# Patient Record
Sex: Female | Born: 1970 | Race: White | Hispanic: No | Marital: Married | Smoking: Former smoker
Health system: Southern US, Community
[De-identification: ages and names within clinical notes are randomized; demographics above are authoritative.]

## PROBLEM LIST (undated history)

## (undated) DIAGNOSIS — M542 Cervicalgia: Secondary | ICD-10-CM

## (undated) DIAGNOSIS — N943 Premenstrual tension syndrome: Secondary | ICD-10-CM

## (undated) DIAGNOSIS — N926 Irregular menstruation, unspecified: Secondary | ICD-10-CM

## (undated) DIAGNOSIS — G43919 Migraine, unspecified, intractable, without status migrainosus: Secondary | ICD-10-CM

## (undated) DIAGNOSIS — G4489 Other headache syndrome: Secondary | ICD-10-CM

## (undated) DIAGNOSIS — J301 Allergic rhinitis due to pollen: Secondary | ICD-10-CM

## (undated) DIAGNOSIS — G542 Cervical root disorders, not elsewhere classified: Secondary | ICD-10-CM

## (undated) DIAGNOSIS — N951 Menopausal and female climacteric states: Secondary | ICD-10-CM

## (undated) DIAGNOSIS — G500 Trigeminal neuralgia: Secondary | ICD-10-CM

## (undated) DIAGNOSIS — J302 Other seasonal allergic rhinitis: Secondary | ICD-10-CM

## (undated) DIAGNOSIS — K529 Noninfective gastroenteritis and colitis, unspecified: Secondary | ICD-10-CM

## (undated) DIAGNOSIS — M530 Cervicocranial syndrome: Secondary | ICD-10-CM

## (undated) HISTORY — DX: Noninfective gastroenteritis and colitis, unspecified: K52.9

## (undated) HISTORY — PX: SYMPATHECTOMY: SHX792

## (undated) HISTORY — DX: Other seasonal allergic rhinitis: J30.2

## (undated) HISTORY — DX: Menopausal and female climacteric states: N95.1

## (undated) HISTORY — DX: Cervicocranial syndrome: M53.0

## (undated) HISTORY — DX: Cervical root disorders, not elsewhere classified: G54.2

## (undated) HISTORY — DX: Premenstrual tension syndrome: N94.3

## (undated) HISTORY — DX: Cervicalgia: M54.2

## (undated) HISTORY — DX: Irregular menstruation, unspecified: N92.6

## (undated) HISTORY — DX: Other headache syndrome: G44.89

## (undated) HISTORY — DX: Allergic rhinitis due to pollen: J30.1

## (undated) HISTORY — DX: Migraine, unspecified, intractable, without status migrainosus: G43.919

## (undated) HISTORY — DX: Trigeminal neuralgia: G50.0

---
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
## 2002-03-04 HISTORY — PX: SYMPATHECTOMY: SHX792

## 2015-01-06 ENCOUNTER — Ambulatory Visit: Payer: Managed Care, Other (non HMO) | Admitting: Neurology

## 2015-01-10 ENCOUNTER — Encounter: Payer: Self-pay | Admitting: Neurology

## 2015-01-10 ENCOUNTER — Ambulatory Visit (INDEPENDENT_AMBULATORY_CARE_PROVIDER_SITE_OTHER): Payer: Managed Care, Other (non HMO) | Admitting: Neurology

## 2015-01-10 VITALS — BP 123/72 | HR 82 | Ht 63.0 in | Wt 130.2 lb

## 2015-01-10 VITALS — BP 117/64 | HR 76 | Temp 98.4°F | Ht 63.0 in

## 2015-01-10 DIAGNOSIS — M5481 Occipital neuralgia: Secondary | ICD-10-CM | POA: Diagnosis not present

## 2015-01-10 DIAGNOSIS — G43701 Chronic migraine without aura, not intractable, with status migrainosus: Secondary | ICD-10-CM

## 2015-01-10 DIAGNOSIS — G500 Trigeminal neuralgia: Secondary | ICD-10-CM

## 2015-01-10 DIAGNOSIS — R5382 Chronic fatigue, unspecified: Secondary | ICD-10-CM | POA: Diagnosis not present

## 2015-01-10 DIAGNOSIS — G43011 Migraine without aura, intractable, with status migrainosus: Secondary | ICD-10-CM | POA: Diagnosis not present

## 2015-01-10 DIAGNOSIS — F329 Major depressive disorder, single episode, unspecified: Secondary | ICD-10-CM | POA: Diagnosis not present

## 2015-01-10 DIAGNOSIS — F32A Depression, unspecified: Secondary | ICD-10-CM

## 2015-01-10 MED ORDER — VERAPAMIL HCL ER 120 MG PO CP24: 120.0000 mg | ORAL_CAPSULE | Freq: Every day | ORAL | Status: DC

## 2015-01-10 NOTE — Progress Notes (Signed)
[facility]    Provider:  Dr Jaynee Eagles Referring Provider: Pennie Rushing, MD Primary Care Physician:  No primary care provider on file.  CC:  Migraines  HPI:  Jenna Fitzgerald is a 44 y.o. female here as a referral from Dr. Zigmund Daniel for migraines. She has had them since she was 24. She has had them daily, at one point 18 months continuously. Botox helped in the past as did progesterone. She has had Botox every 3 months for 2 years. She takes progesterone. She needs occipital nerve blocks as needed, botox every 3 months, meloxicam daily. In may she was doing well and stopped her BCP and lexapro and then the migraines worsened. She is still in the cycle of trying to get her headaches back in control. On the botox she was down to 2 headaches a month but now having daily migraines, they can last up to 24 hours, She has light and sound sensitivity, she has left sided throbbing/pulsating, no daily OTC medications or overuse component. The daily headaches since June. She has failed Topiramate , gabapentin, meloxicam, propranolol, topiramate. She did not tolerate propranolol. She is currently on Lexapro. The last time she had botox was October 13th. She has nausea, no vomiting with the headaches, can be up to 8/10. Imitrex sometimes works. Husband has had vasctomy so she is not planning on anymore children. She has 30 headache days per month all of which are migrainous and last up to 24 hours. No aura. She also has fatigue and depression. She does not use caffeine, she keeps a headache diary without food triggers noted, she uses Cephaly daily and does yoga and cardio.  Reviewed notes, labs and imaging from outside physicians, which showed: headaches began at the age of 44 during her menses. Gradually increased to daily migraines and once had them for 18 months consecutively. In June 2016 she stopped the lexapro and BCP and migraines worsened from 2/month to 30.month.  July 31 restarted BCP.    Aug 15 restarted lexapro and noticed improvement.  Last botox October 13 Oct 15: changed from bcp to progesterone, stopped caffeine. Daily cephaly use.  meds tried before: Amitriptyline Anaprox 550 fioricet Diazepam 10mg  camrese cephadyn Codeine Gabapentin 600mg  introvale jolessa Melatonin meloxicam Methylergonovine migra-eeze prometrium Propranolol topiramate Tri-sprintec trokendi c blocks, trigger points, trigeninal blocks, occipital nerve block  11/16/2012: MRi norma 01/05/2007: carotids and echo nml     Review of Systems: Patient complains of symptoms per HPI as well as the following symptoms: fatigue, anemia, headache. Pertinent negatives per HPI. All others negative.   Social History   Social History  . Marital Status: Married    Spouse Name: Phineas Douglas"   . Number of Children: 1  . Years of Education: 16   Occupational History  . [employer]    Social History Main Topics  . Smoking status: Former Smoker    Quit date: 03/04/2004  . Smokeless tobacco: Not on file  . Alcohol Use: 0.0 oz/week    0 Standard drinks or equivalent per week     Comment: Rare  . Drug Use: No  . Sexual Activity: Not on file   Other Topics Concern  . Not on file   Social History Narrative   Lives at home with husband and son   Caffeine use:  Drinks decaf drinks           Family History  Problem Relation Age of Onset  . Diabetes Father   . Non-Hodgkin's lymphoma  Mother     Pancreas  . Migraines Neg Hx     Past Medical History  Diagnosis Date  . Refractory migraine   . Trigeminal neuralgia   . Gastroenteritis   . Premenstrual tension syndrome   . Irregular periods   . Menopausal syndrome   . Neck pain   . Cervicocranial syndrome   . Chronic mixed headache syndrome   . Cervical syndrome     Past Surgical History  Procedure Laterality Date  . Sympathectomy Bilateral     Current Outpatient Prescriptions  Medication Sig Dispense Refill  .  baclofen (LIORESAL) 10 MG tablet Take 10 mg by mouth 3 (three) times daily.    Marland Kitchen escitalopram (LEXAPRO) 10 MG tablet Take 10 mg by mouth daily.  3  . magnesium gluconate (MAGONATE) 500 MG tablet Take 500 mg by mouth daily.    . minocycline (DYNACIN) 100 MG tablet Take 100 mg by mouth daily.    . Multiple Vitamins-Minerals (MULTIVITAMIN ADULT PO) Take 1 tablet by mouth daily.    . naproxen sodium (ANAPROX) 550 MG tablet Take 550 mg by mouth every 12 (twelve) hours as needed.  12  . omeprazole (PRILOSEC) 40 MG capsule Take 40 mg by mouth daily.  6  . OnabotulinumtoxinA (BOTOX IJ) Inject 1 Dose as directed every 3 (three) months.    . progesterone (PROMETRIUM) 100 MG capsule Take 3 capsules by mouth daily.  12  . SUMAtriptan (IMITREX) 100 MG tablet Take 100 mg by mouth 2 (two) times daily as needed.  12  . meloxicam (MOBIC) 15 MG tablet Take 15 mg by mouth daily.   12  . oxyCODONE-acetaminophen (PERCOCET/ROXICET) 5-325 MG tablet Take 2 tablets by mouth every 6 (six) hours as needed.  0  . verapamil (VERELAN PM) 120 MG 24 hr capsule Take 1 capsule (120 mg total) by mouth at bedtime. 30 capsule 6   No current facility-administered medications for this visit.    Allergies as of 01/10/2015  . (No Known Allergies)    Vitals: BP 123/72 mmHg  Pulse 82  Ht 5\' 3"  (1.6 m)  Wt 130 lb 3.2 oz (59.058 kg)  BMI 23.07 kg/m2 Last Weight:  Wt Readings from Last 1 Encounters:  01/10/15 130 lb 3.2 oz (59.058 kg)   Last Height:   Ht Readings from Last 1 Encounters:  01/10/15 5\' 3"  (1.6 m)   Physical exam: Exam: Gen: NAD, conversant, well nourised, well groomed                     CV: RRR, no MRG. No Carotid Bruits. No peripheral edema, warm, nontender Eyes: Conjunctivae clear without exudates or hemorrhage  Neuro: Detailed Neurologic Exam  Speech:    Speech is normal; fluent and spontaneous with normal comprehension.  Cognition:    The patient is oriented to person, place, and time;      recent and remote memory intact;     language fluent;     normal attention, concentration,     fund of knowledge Cranial Nerves:    The pupils are equal, round, and reactive to light. The fundi are normal and spontaneous venous pulsations are present. Visual fields are full to finger confrontation. Extraocular movements are intact. Trigeminal sensation is intact and the muscles of mastication are normal. The face is symmetric. The palate elevates in the midline. Hearing intact. Voice is normal. Shoulder shrug is normal. The tongue has normal motion without fasciculations.   Coordination:  Normal finger to nose and heel to shin. Normal rapid alternating movements.   Gait:    Heel-toe and tandem gait are normal.   Motor Observation:    No asymmetry, no atrophy, and no involuntary movements noted. Tone:    Normal muscle tone.    Posture:    Posture is normal. normal erect    Strength:    Strength is V/V in the upper and lower limbs.      Sensation: intact to LT     Reflex Exam:  DTR's:    Deep tendon reflexes in the upper and lower extremities are normal bilaterally.   Toes:    The toes are downgoing bilaterally.   Clonus:    Clonus is absent.       Assessment/Plan:  44 year old female with a long history of chronic migraines without aura, not intractable, with status migrainosus. Has failed multiple medications. Is on botox every 3 months.   Migraines: Continue botox,lexapro, progresterone, yoga, meditation, cephaly. add verapamil Depression: continue Lexapro Fatigue: discussed good sleep hygiene   Will start Verapamil. Discussed side effects which can include Dizziness, slow heartbeat, constipation, stomach upset, nausea, headache, and tiredness. If any of these effects persist or worsen or you experience anything else please stop medication and call us. Also gave him UpToDate patient handout on the medication.     To prevent or relieve headaches, try the  following: Cool Compress. Lie down and place a cool compress on your head.  Avoid headache triggers. If certain foods or odors seem to have triggered your migraines in the past, avoid them. A headache diary might help you identify triggers.  Include physical activity in your daily routine. Try a daily walk or other moderate aerobic exercise.  Manage stress. Find healthy ways to cope with the stressors, such as delegating tasks on your to-do list.  Practice relaxation techniques. Try deep breathing, yoga, massage and visualization.  Eat regularly. Eating regularly scheduled meals and maintaining a healthy diet might help prevent headaches. Also, drink plenty of fluids.  Follow a regular sleep schedule. Sleep deprivation might contribute to headaches Consider biofeedback. With this mind-body technique, you learn to control certain bodily functions - such as muscle tension, heart rate and blood pressure - to prevent headaches or reduce headache pain.    Proceed to emergency room if you experience new or worsening symptoms or symptoms do not resolve, if you have new neurologic symptoms or if headache is severe, or for any concerning symptom.   Cc; Dr. Dimple Casey, MD  [facility] [address] [place], [place] [address]  Phone [phone] Fax [phone]

## 2015-01-10 NOTE — Progress Notes (Signed)
Lidocaine 2%- 12mL total Lot: [id number] Expiration: 01/2018  Bupivacaine HCL 0.5% -- 110mL total Lot: [id number] Expiration: 08/2016

## 2015-01-10 NOTE — Patient Instructions (Signed)
Overall you are doing fairly well but I do want to suggest a few things today:   Remember to drink plenty of fluid, eat healthy meals and do not skip any meals. Try to eat protein with a every meal and eat a healthy snack such as fruit or nuts in between meals. Try to keep a regular sleep-wake schedule and try to exercise daily, particularly in the form of walking, 20-30 minutes a day, if you can.   As far as your medications are concerned, I would like to suggest: Start Verapamil 120mg  at bedtime, Try Relpax or Onzetra at onset for next headache. Can repeat in 2 hours. Max twice in one day or 2 days a week.  I would like to see you back in 3 months, sooner if we need to. Please call us with any interim questions, concerns, problems, updates or refill requests.   Please also call us for any test results so we can go over those with you on the phone.  My clinical assistant and will answer any of your questions and relay your messages to me and also relay most of my messages to you.   Our phone number is [phone]. We also have an after hours call service for urgent matters and there is a physician on-call for urgent questions. For any emergencies you know to call 911 or go to the nearest emergency room

## 2015-01-17 ENCOUNTER — Encounter: Payer: Self-pay | Admitting: Neurology

## 2015-01-17 DIAGNOSIS — G43701 Chronic migraine without aura, not intractable, with status migrainosus: Secondary | ICD-10-CM | POA: Insufficient documentation

## 2015-01-17 NOTE — Progress Notes (Signed)
    NERVE BLOCK PROCEDURE NOTE  History: Patient with current intractable migraine, will perform blocks.   Procedure: Patient was consented for left occipital and left trigeminal nerve blocks. A solution containing 0.5% 5mg /ml Bupivacaine and 2% Lidocaine  4-cc each and 80mg  Depo Medrol 1cc was prepared in 3 3-CC syringes with 27 gauge 1/2 inch needle.   9 Target areas in the suboccipital, occipital and temporal regions were identified via palpation and pain response.The sites junctions were sterilized with alcohol wipes. The sites were sterilized with alcohol wipes. 85ml was injected at each temporal and occipital site. The contents of each syringe was injected in a fanlike fashion. The headache improved from 8/10 to 2/10. Patient tolerated the procedure well and no complications were noted.    Consent was provided below and patient acknowledged understanding:   Depo-Medrol 80mg /ml NDC [id number]  Expiration date: 03/2015 Lot number: [id number]  Bupivicaine 50mg /13ml NDC [id number]  Bupivacaine HCL 0.5% -- 38mL total  Lot: [id number]  Expiration: 08/2016   Lidocaine 2% NDC [id number]  Lidocaine 2%- 33mL total  Lot: [id number]  Expiration: 01/2018   What to expect afterwards?  Immediately after the injection, the back of your head may feel warm and numb. You may also experience reduction in the pain. The local anaesthetic wears off in a few hours and the steroid usually takes  3-7 days to take effect.   The pain relief is vary variable and can last from a few days to several months. Some patients do not experience any pain relief. Hence it is difficult to predict the outcome of the injection treatment in a particular patient.   There may be some discomfort at the injection site for a couple of days after treatment, however, this should settle quite quickly. We advise you to take things easy for the rest of the day. Continue taking your pain medication as advised by your consultant or  until you feel benefit from the treatment.   What are the side effects / complications?  Common   Soreness / bruising at the injection site.   Temporary increase (up to 7 days) in pain following procedure.   Rare   Bleeding   Infection at the injection site   Allergic reaction   New pain   Worsening pain

## 2015-02-13 ENCOUNTER — Ambulatory Visit: Payer: Managed Care, Other (non HMO) | Admitting: Neurology

## 2015-02-13 ENCOUNTER — Encounter: Payer: Self-pay | Admitting: Neurology

## 2015-02-13 ENCOUNTER — Ambulatory Visit (INDEPENDENT_AMBULATORY_CARE_PROVIDER_SITE_OTHER): Payer: Managed Care, Other (non HMO) | Admitting: Neurology

## 2015-02-13 VITALS — BP 116/63 | HR 81 | Resp 20 | Ht 63.0 in | Wt 130.0 lb

## 2015-02-13 DIAGNOSIS — G43701 Chronic migraine without aura, not intractable, with status migrainosus: Secondary | ICD-10-CM | POA: Diagnosis not present

## 2015-02-13 DIAGNOSIS — G43709 Chronic migraine without aura, not intractable, without status migrainosus: Secondary | ICD-10-CM | POA: Diagnosis not present

## 2015-02-13 MED ORDER — VERAPAMIL HCL ER 180 MG PO CP24: 180.0000 mg | ORAL_CAPSULE | Freq: Every day | ORAL | Status: DC

## 2015-02-13 NOTE — Progress Notes (Signed)
[facility]    Provider:  Dr Jaynee Eagles Referring Provider: No ref. provider found Primary Care Physician:  Yvone Neu, MD  CC: Migraines  Interval update 02/13/2015: She is feeling better. The frequency and severity has decreased.  She is coming back January 9th for botox. Discussed cgrp trial. But sh ehas a low-grade headache daily. Responsive to sumatriptan.   HPI: Jenna Fitzgerald is a 44 y.o. female here as a referral from Dr. Zigmund Daniel for migraines. She has had them since she was 24. She has had them daily, at one point 18 months continuously. Botox helped in the past as did progesterone. She has had Botox every 3 months for 2 years. She takes progesterone. She needs occipital nerve blocks as needed, botox every 3 months, meloxicam daily. In may she was doing well and stopped her BCP and lexapro and then the migraines worsened. She is still in the cycle of trying to get her headaches back in control. On the botox she was down to 2 headaches a month but now having daily migraines, they can last up to 24 hours, She has light and sound sensitivity, she has left sided throbbing/pulsating, no daily OTC medications or overuse component. The daily headaches since June. She has failed Topiramate , gabapentin, meloxicam, propranolol, topiramate. She did not tolerate propranolol. She is currently on Lexapro. The last time she had botox was October 13th. She has nausea, no vomiting with the headaches, can be up to 8/10. Imitrex sometimes works. Husband has had vasctomy so she is not planning on anymore children. She has 30 headache days per month all of which are migrainous and last up to 24 hours. No aura. She also has fatigue and depression. She does not use caffeine, she keeps a headache diary without food triggers noted, she uses Cephaly daily and does yoga and cardio.  Reviewed notes, labs and imaging from outside physicians, which showed: headaches began at the age of 22  during her menses. Gradually increased to daily migraines and once had them for 18 months consecutively. In June 2016 she stopped the lexapro and BCP and migraines worsened from 2/month to 30.month.  July 31 restarted BCP.  Aug 15 restarted lexapro and noticed improvement.  Last botox October 13 Oct 15: changed from bcp to progesterone, stopped caffeine. Daily cephaly use.  meds tried before: Amitriptyline Anaprox 550 fioricet Diazepam 10mg  camrese cephadyn Codeine Gabapentin 600mg  introvale jolessa Melatonin meloxicam Methylergonovine migra-eeze prometrium Propranolol topiramate Tri-sprintec trokendi c blocks, trigger points, trigeninal blocks, occipital nerve block  11/16/2012: MRi norma 01/05/2007: carotids and echo nml     Social History   Social History  . Marital Status: Married    Spouse Name: Phineas Douglas"   . Number of Children: 1  . Years of Education: 16   Occupational History  . [employer]    Social History Main Topics  . Smoking status: Former Smoker    Quit date: 03/04/2004  . Smokeless tobacco: Not on file  . Alcohol Use: 0.0 oz/week    0 Standard drinks or equivalent per week     Comment: Rare  . Drug Use: No  . Sexual Activity: Not on file   Other Topics Concern  . Not on file   Social History Narrative   Lives at home with husband and son   Caffeine use:  Drinks decaf drinks           Family History  Problem Relation Age of Onset  . Diabetes Father   .  Non-Hodgkin's lymphoma Mother     Pancreas  . Migraines Neg Hx     Past Medical History  Diagnosis Date  . Refractory migraine   . Trigeminal neuralgia   . Gastroenteritis   . Premenstrual tension syndrome   . Irregular periods   . Menopausal syndrome   . Neck pain   . Cervicocranial syndrome   . Chronic mixed headache syndrome   . Cervical syndrome     Past Surgical History  Procedure Laterality Date  . Sympathectomy Bilateral     Current Outpatient  Prescriptions  Medication Sig Dispense Refill  . escitalopram (LEXAPRO) 10 MG tablet Take 10 mg by mouth daily.  3  . magnesium gluconate (MAGONATE) 500 MG tablet Take 500 mg by mouth daily.    . minocycline (DYNACIN) 100 MG tablet Take 100 mg by mouth daily.    . Multiple Vitamins-Minerals (MULTIVITAMIN ADULT PO) Take 1 tablet by mouth daily.    . naproxen sodium (ANAPROX) 550 MG tablet Take 550 mg by mouth every 12 (twelve) hours as needed.  12  . OnabotulinumtoxinA (BOTOX IJ) Inject 1 Dose as directed every 3 (three) months.    Marland Kitchen oxyCODONE-acetaminophen (PERCOCET/ROXICET) 5-325 MG tablet Take 2 tablets by mouth every 6 (six) hours as needed.  0  . progesterone (PROMETRIUM) 100 MG capsule Take 3 capsules by mouth daily.  12  . SUMAtriptan (IMITREX) 100 MG tablet Take 100 mg by mouth 2 (two) times daily as needed.  12  . verapamil (VERELAN PM) 120 MG 24 hr capsule Take 1 capsule (120 mg total) by mouth at bedtime. 30 capsule 6  . meloxicam (MOBIC) 15 MG tablet Take 15 mg by mouth daily.   12   No current facility-administered medications for this visit.    Allergies as of 02/13/2015  . (No Known Allergies)    Vitals: BP 116/63 mmHg  Pulse 81  Resp 20  Ht 5\' 3"  (1.6 m)  Wt 130 lb (58.968 kg)  BMI 23.03 kg/m2 Last Weight:  Wt Readings from Last 1 Encounters:  02/13/15 130 lb (58.968 kg)   Last Height:   Ht Readings from Last 1 Encounters:  02/13/15 5\' 3"  (1.6 m)   Physical exam: Exam: Gen: NAD, conversant, well nourised, obese, well groomed                      Neuro: Detailed Neurologic Exam  Speech:    Speech is normal; fluent and spontaneous with normal comprehension.  Cognition:    The patient is oriented to person, place, and time;     recent and remote memory intact;     language fluent;     normal attention, concentration,     fund of knowledge Cranial Nerves:    The pupils are equal, round, and reactive to light. The fundi are normal and spontaneous venous  pulsations are present. Visual fields are full to finger confrontation. Extraocular movements are intact. Trigeminal sensation is intact and the muscles of mastication are normal. The face is symmetric. The palate elevates in the midline. Hearing intact. Voice is normal. Shoulder shrug is normal. The tongue has normal motion without fasciculations.     Assessment/Plan: 44 year old female with a long history of chronic migraines without aura, not intractable, with status migrainosus. Has failed multiple medications. Is on botox every 3 months.   Migraines: Continue botox,lexapro, progresterone, yoga, meditation, cephaly. Increase verapamil Depression: continue Lexapro Fatigue: discussed good sleep hygiene   Will  increase Verapamil. Discussed side effects which can include Dizziness, slow heartbeat, constipation, stomach upset, nausea, headache, and tiredness. If any of these effects persist or worsen or you experience anything else please stop medication and call us. Also gave him UpToDate patient handout on the medication.   Sarina Ill, MD  [facility] [address] [place], [place] [address]  Phone [phone] Fax [phone]  A total of 30 minutes was spent face-to-face with this patient. Over half this time was spent on counseling patient on the migraine diagnosis and different diagnostic and therapeutic options available.

## 2015-02-14 ENCOUNTER — Encounter: Payer: Self-pay | Admitting: *Deleted

## 2015-02-14 ENCOUNTER — Telehealth: Payer: Self-pay | Admitting: Neurology

## 2015-02-14 NOTE — Telephone Encounter (Signed)
Called and left the patient a VM regarding her botox apt. I need to know if her insurance is changing at the beginning of the year (2017). If it is NOT changing please just take a phone note and let me know. No need to speak with patient unless there will be changes.

## 2015-02-14 NOTE — Progress Notes (Signed)
Faxed [place] enrollment form to [facility] per Dr Jaynee Eagles Request. Received confirmation. Sent copy to medical records.

## 2015-02-14 NOTE — Telephone Encounter (Signed)
Patient stated that her insurance is not changing.

## 2015-02-14 NOTE — Progress Notes (Signed)
Faxed onzetra enrollment to Sherril Cong ([facility]). Received confirmation. Sent copy to medical records.

## 2015-02-16 ENCOUNTER — Encounter: Payer: Self-pay | Admitting: *Deleted

## 2015-02-16 ENCOUNTER — Other Ambulatory Visit: Payer: Self-pay | Admitting: *Deleted

## 2015-02-16 MED ORDER — SUMATRIPTAN SUCCINATE 11 MG/NOSEPC NA EXHP: 1.0000 | INHALANT_POWDER | Freq: Once | NASAL | Status: DC

## 2015-02-16 NOTE — Progress Notes (Signed)
Faxed 30 day supply rx onzetra per Sherril Cong request from [place] support to enroll pt in quick start program for free supply for 30 days. Also sent recent office notes for supporting documentation. Received fax confirmation.

## 2015-02-17 ENCOUNTER — Telehealth: Payer: Self-pay | Admitting: Neurology

## 2015-02-21 ENCOUNTER — Other Ambulatory Visit: Payer: Self-pay | Admitting: *Deleted

## 2015-02-21 MED ORDER — SUMATRIPTAN SUCCINATE 11 MG/NOSEPC NA EXHP: 1.0000 | INHALANT_POWDER | Freq: Once | NASAL | Status: DC

## 2015-02-22 ENCOUNTER — Telehealth: Payer: Self-pay

## 2015-02-22 NOTE — Telephone Encounter (Signed)
Holland Falling has approved the request for coverage on Jenna Fitzgerald effective until 02/16/2016 Ref PA# [id number]

## 2015-03-02 NOTE — Telephone Encounter (Signed)
error 

## 2015-03-13 ENCOUNTER — Ambulatory Visit: Payer: Managed Care, Other (non HMO) | Admitting: Neurology

## 2015-03-23 ENCOUNTER — Ambulatory Visit (INDEPENDENT_AMBULATORY_CARE_PROVIDER_SITE_OTHER): Payer: Managed Care, Other (non HMO) | Admitting: Neurology

## 2015-03-23 ENCOUNTER — Encounter: Payer: Self-pay | Admitting: Neurology

## 2015-03-23 VITALS — BP 105/55 | HR 74 | Ht 63.0 in | Wt 128.0 lb

## 2015-03-23 DIAGNOSIS — G43701 Chronic migraine without aura, not intractable, with status migrainosus: Secondary | ICD-10-CM

## 2015-03-23 NOTE — Progress Notes (Signed)
Consent Form Botulism Toxin Injection For Chronic Migraine  Botulism toxin has been approved by the Federal drug administration for treatment of chronic migraine. Botulism toxin does not cure chronic migraine and it may not be effective in some patients.  The administration of botulism toxin is accomplished by injecting a small amount of toxin into the muscles of the neck and head. Dosage must be titrated for each individual. Any benefits resulting from botulism toxin tend to wear off after 3 months with a repeat injection required if benefit is to be maintained. Injections are usually done every 3-4 months with maximum effect peak achieved by about 2 or 3 weeks. Botulism toxin is expensive and you should be sure of what costs you will incur resulting from the injection.  The side effects of botulism toxin use for chronic migraine may include:   -Transient, and usually mild, facial weakness with facial injections  -Transient, and usually mild, head or neck weakness with head/neck injections  -Reduction or loss of forehead facial animation due to forehead muscle              weakness  -Eyelid drooping  -Dry eye  -Pain at the site of injection or bruising at the site of injection  -Double vision  -Potential unknown long term risks  Contraindications: You should not have Botox if you are pregnant, nursing, allergic to albumin, have an infection, skin condition, or muscle weakness at the site of the injection, or have myasthenia gravis, Lambert-Eaton syndrome, or ALS.  It is also possible that as with any injection, there may be an allergic reaction or no effect from the medication. Reduced effectiveness after repeated injections is sometimes seen and rarely infection at the injection site may occur. All care will be taken to prevent these side effects. If therapy is given over a long time, atrophy and wasting in the muscle injected may occur. Occasionally the patient's become refractory to  treatment because they develop antibodies to the toxin. In this event, therapy needs to be modified.  I have read the above information and consent to the administration of botulism toxin.  Signature on file  ______________  _____   _________________  Patient signature     Date   Witness signature       BOTOX PROCEDURE NOTE FOR MIGRAINE HEADACHE    Contraindications and precautions discussed with patient(above). Aseptic procedure was observed and patient tolerated procedure. Procedure performed by Dr. Georgia Dom  The condition has existed for more than 6 months, and pt does not have a diagnosis of ALS, Myasthenia Gravis or Lambert-Eaton Syndrome. Risks and benefits of injections discussed and pt agrees to proceed with the procedure. Written consent obtained  These injections are medically necessary. She receives good benefits from these injections. These injections do not cause sedations or hallucinations which the oral therapies may cause.  Indication/Diagnosis: chronic migraine BOTOX(J0585) injection was performed according to protocol by [employer]. 200 units of BOTOX was dissolved into 4 cc NS.  NDC: [id number]  Botox 200units, Lot [id number], Exp 10/2017, [place] [id number], specialty pharmacy**[facility]    Description of procedure:  The patient was placed in a sitting position. The standard protocol was used for Botox as follows, with 5 units of Botox injected at each site:   -Procerus muscle, midline injection  -Corrugator muscle, bilateral injection  -Frontalis muscle, bilateral injection, with 2 sites each side, medial injection was performed in the upper one third of the frontalis muscle, in the region vertical  from the medial inferior edge of the superior orbital rim. The lateral injection was again in the upper one third of the forehead vertically above the lateral limbus of the cornea, 1.5 cm lateral to the medial injection site.  -Temporalis muscle injection, 4 sites,  bilaterally. The first injection was 3 cm above the tragus of the ear, second injection site was 1.5 cm to 3 cm up from the first injection site in line with the tragus of the ear. The third injection site was 1.5-3 cm forward between the first 2 injection sites. The fourth injection site was 1.5 cm posterior to the second injection site.  -Occipitalis muscle injection, 3 sites, bilaterally. The first injection was done one half way between the occipital protuberance and the tip of the mastoid process behind the ear. The second injection site was done lateral and superior to the first, 1 fingerbreadth from the first injection. The third injection site was 1 fingerbreadth superiorly and medially from the first injection site.  -Cervical paraspinal muscle injection, 2 sites, bilateral knee first injection site was 1 cm from the midline of the cervical spine, 3 cm inferior to the lower border of the occipital protuberance. The second injection site was 1.5 cm superiorly and laterally to the first injection site.  -Trapezius muscle injection was performed at 3 sites, bilaterally. The first injection site was in the upper trapezius muscle halfway between the inflection point of the neck, and the acromion. The second injection site was one half way between the acromion and the first injection site. The third injection was done between the first injection site and the inflection point of the neck.   Will return for repeat injection in 3 months.   200 units of Botox was used, 155 units were injected, the rest of the Botox was wasted. The patient tolerated the procedure well, there were no complications of the above procedure.

## 2015-03-27 ENCOUNTER — Encounter: Payer: Self-pay | Admitting: Neurology

## 2015-06-02 ENCOUNTER — Telehealth: Payer: Self-pay | Admitting: Neurology

## 2015-06-02 ENCOUNTER — Encounter: Payer: Self-pay | Admitting: Neurology

## 2015-06-02 NOTE — Telephone Encounter (Signed)
Dr Ahern- FYI 

## 2015-06-02 NOTE — Telephone Encounter (Signed)
Spoke with patient regarding her charge for the last visit of her botox injection.She stated that [employer] was not paying for the procedure code of 52841. I informed her that I would work on finding out why and I also gave her information to sign up for the BOTOX Savings card that will help to cover the charge for the visit in the future if need be. Called and spoke with the claims department at [facility] who stated that the patient's procedure was being considered experimental due to the diagnosis code for chronic migraine. Change in diagnosis code from G43.701 to G43.709 makes no difference in coverage. The representative stated that I could submit clinical documentation providing reasoning for these injections being medically necessary and they would reconsider coverage. I have already faxed clinicals.

## 2015-06-13 ENCOUNTER — Telehealth: Payer: Self-pay | Admitting: Neurology

## 2015-06-13 NOTE — Telephone Encounter (Signed)
[facility] [employer] [facility] called about botox for the pt. Delivery is set up for April 12th. There was a misunderstanding.

## 2015-06-22 ENCOUNTER — Ambulatory Visit (INDEPENDENT_AMBULATORY_CARE_PROVIDER_SITE_OTHER): Payer: Managed Care, Other (non HMO) | Admitting: Neurology

## 2015-06-22 VITALS — BP 118/69 | HR 85

## 2015-06-22 DIAGNOSIS — G43701 Chronic migraine without aura, not intractable, with status migrainosus: Secondary | ICD-10-CM | POA: Diagnosis not present

## 2015-06-22 NOTE — Progress Notes (Signed)
Botox-200unitsx1 vial Lot: [id number] Expiration: 12/2017 [id number]  0.9% Sodium Chloride- 84mL total [id number] Expiration: 01/2017 NDC: [id number]

## 2015-06-22 NOTE — Progress Notes (Signed)
Consent Form Botulism Toxin Injection For Chronic Migraine  Botulism toxin has been approved by the Federal drug administration for treatment of chronic migraine. Botulism toxin does not cure chronic migraine and it may not be effective in some patients.  The administration of botulism toxin is accomplished by injecting a small amount of toxin into the muscles of the neck and head. Dosage must be titrated for each individual. Any benefits resulting from botulism toxin tend to wear off after 3 months with a repeat injection required if benefit is to be maintained. Injections are usually done every 3-4 months with maximum effect peak achieved by about 2 or 3 weeks. Botulism toxin is expensive and you should be sure of what costs you will incur resulting from the injection.  The side effects of botulism toxin use for chronic migraine may include:   -Transient, and usually mild, facial weakness with facial injections  -Transient, and usually mild, head or neck weakness with head/neck injections  -Reduction or loss of forehead facial animation due to forehead muscle              weakness  -Eyelid drooping  -Dry eye  -Pain at the site of injection or bruising at the site of injection  -Double vision  -Potential unknown long term risks  Contraindications: You should not have Botox if you are pregnant, nursing, allergic to albumin, have an infection, skin condition, or muscle weakness at the site of the injection, or have myasthenia gravis, Lambert-Eaton syndrome, or ALS.  It is also possible that as with any injection, there may be an allergic reaction or no effect from the medication. Reduced effectiveness after repeated injections is sometimes seen and rarely infection at the injection site may occur. All care will be taken to prevent these side effects. If therapy is given over a long time, atrophy and wasting in the muscle injected may occur. Occasionally the patient's become refractory to  treatment because they develop antibodies to the toxin. In this event, therapy needs to be modified.  I have read the above information and consent to the administration of botulism toxin.    ___________on file___  _____   _________________  Patient signature     Date   Witness signature       BOTOX PROCEDURE NOTE FOR MIGRAINE HEADACHE    Contraindications and precautions discussed with patient(above). Aseptic procedure was observed and patient tolerated procedure. Procedure performed by Dr. Georgia Dom  The condition has existed for more than 6 months, and pt does not have a diagnosis of ALS, Myasthenia Gravis or Lambert-Eaton Syndrome. Risks and benefits of injections discussed and pt agrees to proceed with the procedure. Written consent obtained  These injections are medically necessary. She receives good benefits from these injections. These injections do not cause sedations or hallucinations which the oral therapies may cause.  Indication/Diagnosis: chronic migraine BOTOX(J0585) injection was performed according to protocol by [employer]. 200 units of BOTOX was dissolved into 4 cc NS.  Botox-200unitsx1 vial  Lot: [id number]  Expiration: 12/2017  [id number]  0.9% Sodium Chloride- 66mL total  [id number]  Expiration: 01/2017  NDC: [id number]    Description of procedure:  The patient was placed in a sitting position. The standard protocol was used for Botox as follows, with 5 units of Botox injected at each site:   -Procerus muscle, midline injection  -Corrugator muscle, bilateral injection  -Frontalis muscle, bilateral injection, with 2 sites each side, medial injection was performed in the upper one third  of the frontalis muscle, in the region vertical from the medial inferior edge of the superior orbital rim. The lateral injection was again in the upper one third of the forehead vertically above the lateral limbus of the cornea, 1.5 cm lateral to the medial injection  site.  -Temporalis muscle injection, 4 sites, bilaterally. The first injection was 3 cm above the tragus of the ear, second injection site was 1.5 cm to 3 cm up from the first injection site in line with the tragus of the ear. The third injection site was 1.5-3 cm forward between the first 2 injection sites. The fourth injection site was 1.5 cm posterior to the second injection site.  -Occipitalis muscle injection, 3 sites, bilaterally. The first injection was done one half way between the occipital protuberance and the tip of the mastoid process behind the ear. The second injection site was done lateral and superior to the first, 1 fingerbreadth from the first injection. The third injection site was 1 fingerbreadth superiorly and medially from the first injection site.  -Cervical paraspinal muscle injection, 2 sites, bilateral knee first injection site was 1 cm from the midline of the cervical spine, 3 cm inferior to the lower border of the occipital protuberance. The second injection site was 1.5 cm superiorly and laterally to the first injection site.  -Trapezius muscle injection was performed at 3 sites, bilaterally. The first injection site was in the upper trapezius muscle halfway between the inflection point of the neck, and the acromion. The second injection site was one half way between the acromion and the first injection site. The third injection was done between the first injection site and the inflection point of the neck.   Will return for repeat injection in 3 months.   200 unit sof Botox was used, 155 units were injected, the rest of the Botox was wasted. The patient tolerated the procedure well, there were no complications of the above procedure.

## 2015-07-02 ENCOUNTER — Encounter: Payer: Self-pay | Admitting: Neurology

## 2015-07-03 NOTE — Telephone Encounter (Signed)
I spoke with the patient and Angie. Angie stated that [employer] [facility] had deemed the procedure "experimental" and at this point the patient would have to pay out of pocket for the procedure cost. She also stated the we could offer the patient a 55% discount since she is paying the full amount out of pocket. I called the patient and relayed this information to her and asked her about the savings program. She stated that she understood and was fine paying the procedure cost since the botox savings card was paying her 300 dollar copay on the medication.

## 2015-07-04 ENCOUNTER — Encounter: Payer: Self-pay | Admitting: Neurology

## 2015-07-05 NOTE — Telephone Encounter (Signed)
I called and spoke to a representative from [place] who informed me that we would have to file an appeal. Spoke with the patient and informed her that we would start an appeal and  that we filed it under the correct ICD-10 code. I also explained to her the difference in the procedure code and ICD-10 code. I explained that [facility] was considering chronic migraine "experimental" and that we have to file under chronic migraine because that is the diagnosis she is being treated for.  I gave her the reference number for her approvals and she was going to call her representative and call me back. I will initiate appeal process today. I gave the patient my direct email so she could contact me directly with future updates or question regarding this case.

## 2015-07-16 ENCOUNTER — Encounter: Payer: Self-pay | Admitting: Neurology

## 2015-07-17 ENCOUNTER — Telehealth: Payer: Self-pay | Admitting: Neurology

## 2015-07-17 NOTE — Telephone Encounter (Signed)
Pt calling in to see if she has an appt to come in for an infusion/migrain cocktail. She has not heard from the office about anything yet. Please call back, pt lives an hr away and will need time to drive.

## 2015-07-17 NOTE — Telephone Encounter (Signed)
Pre Dr Jaynee Eagles, called patient and advised she has appointment for migraine infusion today at 2:30 pm. She stated she has no driver so would not like to receive any medications that may make her sleepy. Dr Jaynee Eagles aware. Patient verbalized understanding, appreciation for call.

## 2015-07-17 NOTE — Telephone Encounter (Signed)
She can come at 2;30 thanks

## 2015-07-17 NOTE — Telephone Encounter (Signed)
Jenna Fitzgerald, can you call patient? She has had a headache for days. Maybe we can do a migraine cocktail tomorrow. If [name] is busy, we can try sphenocath or a DHE injection. Ket me know thanks   Hi Dr. Jaynee Eagles and [name] had a migraine since Wednesday and am wondering if I can come and get one of the migraine cocktails tomorrow (Monday, May 15). As a reminder, I will be driving from [place], so it will take me an hour to get there from the time I receive a reply.

## 2015-07-27 ENCOUNTER — Encounter: Payer: Self-pay | Admitting: Neurology

## 2015-07-27 ENCOUNTER — Other Ambulatory Visit: Payer: Self-pay | Admitting: Neurology

## 2015-07-27 DIAGNOSIS — G43111 Migraine with aura, intractable, with status migrainosus: Secondary | ICD-10-CM

## 2015-07-27 MED ORDER — MELOXICAM 15 MG PO TABS: 15.0000 mg | ORAL_TABLET | Freq: Every day | ORAL | Status: DC

## 2015-07-27 MED ORDER — METHYLPREDNISOLONE 4 MG PO TBPK: ORAL_TABLET | ORAL | Status: DC

## 2015-07-28 ENCOUNTER — Encounter: Payer: Self-pay | Admitting: Neurology

## 2015-08-01 ENCOUNTER — Encounter: Payer: Self-pay | Admitting: Neurology

## 2015-08-04 ENCOUNTER — Ambulatory Visit (INDEPENDENT_AMBULATORY_CARE_PROVIDER_SITE_OTHER): Payer: Managed Care, Other (non HMO) | Admitting: Neurology

## 2015-08-04 ENCOUNTER — Encounter: Payer: Self-pay | Admitting: Neurology

## 2015-08-04 DIAGNOSIS — G43011 Migraine without aura, intractable, with status migrainosus: Secondary | ICD-10-CM

## 2015-08-04 MED ORDER — FROVATRIPTAN SUCCINATE 2.5 MG PO TABS: 2.5000 mg | ORAL_TABLET | ORAL | Status: DC | PRN

## 2015-08-04 MED ORDER — ONDANSETRON 4 MG PO TBDP: 4.0000 mg | ORAL_TABLET | Freq: Three times a day (TID) | ORAL | Status: DC | PRN

## 2015-08-04 MED ORDER — BUTORPHANOL TARTRATE 10 MG/ML NA SOLN: 1.0000 | NASAL | Status: DC | PRN

## 2015-08-04 NOTE — Progress Notes (Signed)
[facility]    Provider:  Dr Jaynee Eagles Referring Provider: Yvone Neu,* Primary Care Physician:  Yvone Neu, MD  CC: Migraines  Intreval history: April 25th headaches worsened. Nothing happened around that time. She has 2 menses in the month of may. She has had a migraine almost every day since then, status migrainosus. She takes imitrex with naproxen at the onset of her headache. She has tried [place] zembrace and other triptans and they don't work any better than   Interval update 02/13/2015: She is feeling better. The frequency and severity has decreased. She is coming back January 9th for botox. Discussed cgrp trial. But sh ehas a low-grade headache daily. Responsive to sumatriptan.   HPI: Jenna Fitzgerald is a 45 y.o. female here as a referral from Dr. Zigmund Daniel for migraines. She has had them since she was 24. She has had them daily, at one point 18 months continuously. Botox helped in the past as did progesterone. She has had Botox every 3 months for 2 years. She takes progesterone. She needs occipital nerve blocks as needed, botox every 3 months, meloxicam daily. In may she was doing well and stopped her BCP and lexapro and then the migraines worsened. She is still in the cycle of trying to get her headaches back in control. On the botox she was down to 2 headaches a month but now having daily migraines, they can last up to 24 hours, She has light and sound sensitivity, she has left sided throbbing/pulsating, no daily OTC medications or overuse component. The daily headaches since June. She has failed Topiramate , gabapentin, meloxicam, propranolol, topiramate. She did not tolerate propranolol. She is currently on Lexapro. The last time she had botox was October 13th. She has nausea, no vomiting with the headaches, can be up to 8/10. Imitrex sometimes works. Husband has had vasctomy so she is not planning on anymore children. She has 30 headache days per  month all of which are migrainous and last up to 24 hours. No aura. She also has fatigue and depression. She does not use caffeine, she keeps a headache diary without food triggers noted, she uses Cephaly daily and does yoga and cardio.  She is on Verapamil, lexapro, imitrex oral.   Reviewed notes, labs and imaging from outside physicians, which showed: headaches began at the age of 67 during her menses. Gradually increased to daily migraines and once had them for 18 months consecutively. In June 2016 she stopped the lexapro and BCP and migraines worsened from 2/month to 30.month.  July 31 restarted BCP.  Aug 15 restarted lexapro and noticed improvement.  Last botox October 13 Oct 15: changed from bcp to progesterone, stopped caffeine. Daily cephaly use.  meds tried before: Amitriptyline Anaprox 550 fioricet Diazepam 10mg  camrese cephadyn Codeine Gabapentin 600mg  introvale jolessa Melatonin meloxicam Methylergonovine migra-eeze prometrium Propranolol topiramate Tri-sprintec trokendi c blocks, trigger points, trigeninal blocks, occipital nerve block  11/16/2012: MRi norma 01/05/2007: carotids and echo nml   Social History   Social History  . Marital Status: Married    Spouse Name: Phineas Douglas"   . Number of Children: 1  . Years of Education: 16   Occupational History  . [employer]    Social History Main Topics  . Smoking status: Former Smoker    Quit date: 03/04/2004  . Smokeless tobacco: Not on file  . Alcohol Use: 0.0 oz/week    0 Standard drinks or equivalent per week     Comment: Rare  .  Drug Use: No  . Sexual Activity: Not on file   Other Topics Concern  . Not on file   Social History Narrative   Lives at home with husband and son   Caffeine use:  Drinks decaf drinks           Family History  Problem Relation Age of Onset  . Diabetes Father   . Non-Hodgkin's lymphoma Mother     Pancreas  . Migraines Neg Hx     Past Medical  History  Diagnosis Date  . Refractory migraine   . Trigeminal neuralgia   . Gastroenteritis   . Premenstrual tension syndrome   . Irregular periods   . Menopausal syndrome   . Neck pain   . Cervicocranial syndrome   . Chronic mixed headache syndrome   . Cervical syndrome     Past Surgical History  Procedure Laterality Date  . Sympathectomy Bilateral     Current Outpatient Prescriptions  Medication Sig Dispense Refill  . butorphanol (STADOL) 10 MG/ML nasal spray Place 1 spray into the nose every 4 (four) hours as needed for headache. 250 mL 4  . escitalopram (LEXAPRO) 10 MG tablet Take 10 mg by mouth daily.  3  . frovatriptan (FROVA) 2.5 MG tablet Take 1 tablet (2.5 mg total) by mouth as needed for migraine. If recurs, may repeat after 2 hours. Max of 3 tabs in 24 hours. 15 tablet 3  . magnesium gluconate (MAGONATE) 500 MG tablet Take 500 mg by mouth daily.    . meloxicam (MOBIC) 15 MG tablet Take 1 tablet (15 mg total) by mouth daily. 30 tablet 12  . Multiple Vitamins-Minerals (MULTIVITAMIN ADULT PO) Take 1 tablet by mouth daily.    . naproxen sodium (ANAPROX) 550 MG tablet Take 550 mg by mouth every 12 (twelve) hours as needed.  12  . OnabotulinumtoxinA (BOTOX IJ) Inject 1 Dose as directed every 3 (three) months.    . ondansetron (ZOFRAN ODT) 4 MG disintegrating tablet Take 1 tablet (4 mg total) by mouth every 8 (eight) hours as needed for nausea or vomiting. 20 tablet 0  . progesterone (PROMETRIUM) 100 MG capsule Take 3 capsules by mouth daily.  12  . SUMAtriptan (IMITREX) 100 MG tablet Take 100 mg by mouth 2 (two) times daily as needed.  12  . verapamil (VERELAN PM) 180 MG 24 hr capsule Take 1 capsule (180 mg total) by mouth at bedtime. 30 capsule 11   No current facility-administered medications for this visit.    Allergies as of 08/04/2015  . (No Known Allergies)    Vitals: There were no vitals taken for this visit. Last Weight:  Wt Readings from Last 1 Encounters:    03/23/15 128 lb (58.06 kg)   Last Height:   Ht Readings from Last 1 Encounters:  03/23/15 5\' 3"  (1.6 m)    Physical exam: Exam: Gen: NAD, conversant, well nourised, obese, well groomed                     CV: RRR, no MRG. No Carotid Bruits. No peripheral edema, warm, nontender Eyes: Conjunctivae clear without exudates or hemorrhage  Neuro: Detailed Neurologic Exam  Speech:    Speech is normal; fluent and spontaneous with normal comprehension.  Cognition:    The patient is oriented to person, place, and time;     recent and remote memory intact;     language fluent;     normal attention, concentration,  fund of knowledge Cranial Nerves:    The pupils are equal, round, and reactive to light. The fundi are normal and spontaneous venous pulsations are present. Visual fields are full to finger confrontation. Extraocular movements are intact. Trigeminal sensation is intact and the muscles of mastication are normal. The face is symmetric. The palate elevates in the midline. Hearing intact. Voice is normal. Shoulder shrug is normal. The tongue has normal motion without fasciculations.   Coordination:    Normal finger to nose and heel to shin. Normal rapid alternating movements.   Gait:    Heel-toe and tandem gait are normal.   Motor Observation:    No asymmetry, no atrophy, and no involuntary movements noted. Tone:    Normal muscle tone.    Posture:    Posture is normal. normal erect    Strength:    Strength is V/V in the upper and lower limbs.      Sensation: intact to LT     Reflex Exam:  DTR's:    Deep tendon reflexes in the upper and lower extremities are normal bilaterally.   Toes:    The toes are downgoing bilaterally.   Clonus:    Clonus is absent.      Assessment/Plan:  45 year old intractable migraines, tried and failed over 20 medications. Today she comes back with intractable headache/migraine for over 30 days. We'll perform migraine cocktail and DHE  injection 1/2 mL in each arm the deltoid with a vital signs recorded before. Vital signs at onset 118/71 pulse 72 and afterwards 118/75 with pulse 68..  Is on botox every 3 months.   Migraines: Continue botox,lexapro, progresterone, yoga, meditation, cephaly. verapamil Depression: continue Lexapro Fatigue: discussed good sleep hygiene  As far as your medications are concerned, I would like to suggest: At onset of headache: Frova, Zofran, Naproxen. Buprenorphine spray if this doesn't work. Discussed that deeper [facility] spray is on narcotics with increased risk for dependence and abuse. Provided patient documentation or drug.  Sarina Ill, MD  [facility] [address] [place], [place] [address]  Phone [phone] Fax [phone]  A total of 30 minutes was spent face-to-face with this patient. Over half this time was spent on counseling patient on the intractable migraine diagnosis and different diagnostic and therapeutic options available.

## 2015-08-04 NOTE — Patient Instructions (Signed)
Remember to drink plenty of fluid, eat healthy meals and do not skip any meals. Try to eat protein with a every meal and eat a healthy snack such as fruit or nuts in between meals. Try to keep a regular sleep-wake schedule and try to exercise daily, particularly in the form of walking, 20-30 minutes a day, if you can.   As far as your medications are concerned, I would like to suggest: At onset of headache: Frova, Zofran, Naproxen. Buprenorphine spray if this doesn't work  I would like to see you back, sooner if we need to. Please call us with any interim questions, concerns, problems, updates or refill requests.    Our phone number is [phone]. We also have an after hours call service for urgent matters and there is a physician on-call for urgent questions. For any emergencies you know to call 911 or go to the nearest emergency room

## 2015-08-05 ENCOUNTER — Encounter: Payer: Self-pay | Admitting: Neurology

## 2015-08-06 ENCOUNTER — Encounter: Payer: Self-pay | Admitting: Neurology

## 2015-08-07 ENCOUNTER — Telehealth: Payer: Self-pay | Admitting: Neurology

## 2015-08-07 ENCOUNTER — Other Ambulatory Visit: Payer: Self-pay | Admitting: Neurology

## 2015-08-07 MED ORDER — DIVALPROEX SODIUM ER 500 MG PO TB24: 500.0000 mg | ORAL_TABLET | Freq: Every day | ORAL | Status: DC

## 2015-08-07 NOTE — Telephone Encounter (Signed)
Thanks, each bottle is apparently multi use at 2.48ml and not single use. So we will provide one bottle for up to 10 sprays a month in the nostril thanks

## 2015-08-07 NOTE — Telephone Encounter (Signed)
Belinda with [facility], [place], [place] is calling as she has questions(duration, quantity) about the Rx butorphanol(STADOL) 10 mg/ML nasal spray. Please call @ [phone].

## 2015-08-07 NOTE — Telephone Encounter (Signed)
Dr Jaynee Eagles- see below. Do you also want to document? Called pharmacy back. Dr Jaynee Eagles spoke to her on phone and told her to provide pt with 2.95mL, not 256mL that was listed on rx.

## 2015-08-09 ENCOUNTER — Encounter: Payer: Self-pay | Admitting: Neurology

## 2015-09-28 ENCOUNTER — Ambulatory Visit (INDEPENDENT_AMBULATORY_CARE_PROVIDER_SITE_OTHER): Payer: Managed Care, Other (non HMO) | Admitting: Neurology

## 2015-09-28 ENCOUNTER — Encounter: Payer: Self-pay | Admitting: Neurology

## 2015-09-28 VITALS — BP 107/63 | HR 79

## 2015-09-28 DIAGNOSIS — G43701 Chronic migraine without aura, not intractable, with status migrainosus: Secondary | ICD-10-CM | POA: Diagnosis not present

## 2015-09-28 MED ORDER — ONDANSETRON 4 MG PO TBDP: 4.0000 mg | ORAL_TABLET | Freq: Three times a day (TID) | ORAL | 12 refills | Status: DC | PRN

## 2015-09-28 MED ORDER — ESCITALOPRAM OXALATE 10 MG PO TABS: 10.0000 mg | ORAL_TABLET | Freq: Every day | ORAL | 12 refills | Status: DC

## 2015-09-28 MED ORDER — NAPROXEN SODIUM 550 MG PO TABS: 550.0000 mg | ORAL_TABLET | Freq: Two times a day (BID) | ORAL | 12 refills | Status: DC | PRN

## 2015-09-28 MED ORDER — VERAPAMIL HCL ER 180 MG PO CP24: 180.0000 mg | ORAL_CAPSULE | Freq: Every day | ORAL | 12 refills | Status: DC

## 2015-09-28 MED ORDER — DIHYDROERGOTAMINE MESYLATE 4 MG/ML NA SOLN: 1.0000 | NASAL | 12 refills | Status: DC | PRN

## 2015-09-28 MED ORDER — SUMATRIPTAN SUCCINATE 100 MG PO TABS: 100.0000 mg | ORAL_TABLET | Freq: Two times a day (BID) | ORAL | 12 refills | Status: DC | PRN

## 2015-09-28 NOTE — Progress Notes (Signed)
Consent Form Botulism Toxin Injection For Chronic Migraine  Botulism toxin has been approved by the Federal drug administration for treatment of chronic migraine. Botulism toxin does not cure chronic migraine and it may not be effective in some patients.  The administration of botulism toxin is accomplished by injecting a small amount of toxin into the muscles of the neck and head. Dosage must be titrated for each individual. Any benefits resulting from botulism toxin tend to wear off after 3 months with a repeat injection required if benefit is to be maintained. Injections are usually done every 3-4 months with maximum effect peak achieved by about 2 or 3 weeks. Botulism toxin is expensive and you should be sure of what costs you will incur resulting from the injection.  The side effects of botulism toxin use for chronic migraine may include:   -Transient, and usually mild, facial weakness with facial injections  -Transient, and usually mild, head or neck weakness with head/neck injections  -Reduction or loss of forehead facial animation due to forehead muscle              weakness  -Eyelid drooping  -Dry eye  -Pain at the site of injection or bruising at the site of injection  -Double vision  -Potential unknown long term risks  Contraindications: You should not have Botox if you are pregnant, nursing, allergic to albumin, have an infection, skin condition, or muscle weakness at the site of the injection, or have myasthenia gravis, Lambert-Eaton syndrome, or ALS.  It is also possible that as with any injection, there may be an allergic reaction or no effect from the medication. Reduced effectiveness after repeated injections is sometimes seen and rarely infection at the injection site may occur. All care will be taken to prevent these side effects. If therapy is given over a long time, atrophy and wasting in the muscle injected may occur. Occasionally the patient's become refractory to  treatment because they develop antibodies to the toxin. In this event, therapy needs to be modified.  I have read the above information and consent to the administration of botulism toxin.    ______________  _____   _________________  Patient signature     Date   Witness signature       BOTOX PROCEDURE NOTE FOR MIGRAINE HEADACHE    Contraindications and precautions discussed with patient(above). Aseptic procedure was observed and patient tolerated procedure. Procedure performed by Dr. Georgia Dom  The condition has existed for more than 6 months, and pt does not have a diagnosis of ALS, Myasthenia Gravis or Lambert-Eaton Syndrome. Risks and benefits of injections discussed and pt agrees to proceed with the procedure. Written consent obtained  These injections are medically necessary. He receives good benefits from these injections. These injections do not cause sedations or hallucinations which the oral therapies may cause.  Indication/Diagnosis: chronic migraine BOTOX(J0585) injection was performed according to protocol by [employer]. 200 units of BOTOX was dissolved into 4 cc NS.  NDC: [address]-02  Type of toxin: Botox- 200 units Lot # [id number] EXP: 04/2018 [id number]  Sodium Chloride 0.9%- 90mL [id number] Expiration: 01/2017 NDC: [id number]   Description of procedure:  The patient was placed in a sitting position. The standard protocol was used for Botox as follows, with 5 units of Botox injected at each site:   -Procerus muscle, midline injection  -Corrugator muscle, bilateral injection  -Frontalis muscle, bilateral injection, with 2 sites each side, medial injection was performed in the upper one third of  the frontalis muscle, in the region vertical from the medial inferior edge of the superior orbital rim. The lateral injection was again in the upper one third of the forehead vertically above the lateral limbus of the cornea, 1.5 cm lateral to the medial  injection site.  -Temporalis muscle injection, 4 sites, bilaterally. The first injection was 3 cm above the tragus of the ear, second injection site was 1.5 cm to 3 cm up from the first injection site in line with the tragus of the ear. The third injection site was 1.5-3 cm forward between the first 2 injection sites. The fourth injection site was 1.5 cm posterior to the second injection site.  -Occipitalis muscle injection, 3 sites, bilaterally. The first injection was done one half way between the occipital protuberance and the tip of the mastoid process behind the ear. The second injection site was done lateral and superior to the first, 1 fingerbreadth from the first injection. The third injection site was 1 fingerbreadth superiorly and medially from the first injection site.  -Cervical paraspinal muscle injection, 2 sites, bilateral knee first injection site was 1 cm from the midline of the cervical spine, 3 cm inferior to the lower border of the occipital protuberance. The second injection site was 1.5 cm superiorly and laterally to the first injection site.  -Trapezius muscle injection was performed at 3 sites, bilaterally with 10 units at each site. The first injection site was in the upper trapezius muscle halfway between the inflection point of the neck, and the acromion. The second injection site was one half way between the acromion and the first injection site. The third injection was done between the first injection site and the inflection point of the neck.   Will return for repeat injection in 3 months.   A 200 unit sof Botox was used, 195 units were injected, the rest of the Botox was wasted. The patient tolerated the procedure well, there were no complications of the above procedure.  Additional units in the trapezius muscles and an additional near the temple on the left side 5 units

## 2015-10-19 ENCOUNTER — Encounter: Payer: Self-pay | Admitting: Neurology

## 2015-10-20 ENCOUNTER — Encounter: Payer: Self-pay | Admitting: Neurology

## 2015-10-23 ENCOUNTER — Encounter: Payer: Self-pay | Admitting: Neurology

## 2015-10-23 NOTE — Telephone Encounter (Signed)
Called pt back. Advised per Dr Jaynee Eagles she can come for migraine cocktail today. She said she can be here by 10am. I advised she needs to have a driver d/t infusion causing her to be drowsy. She verbalized understanding. I verified she has NKA and insurance still [place].

## 2015-11-01 ENCOUNTER — Encounter: Payer: Self-pay | Admitting: Neurology

## 2015-11-02 ENCOUNTER — Other Ambulatory Visit: Payer: Self-pay | Admitting: Neurology

## 2015-11-06 ENCOUNTER — Encounter: Payer: Self-pay | Admitting: Neurology

## 2015-11-07 ENCOUNTER — Other Ambulatory Visit: Payer: Self-pay | Admitting: Neurology

## 2015-11-07 MED ORDER — TOPIRAMATE ER 50 MG PO CAP24: 50.0000 mg | ORAL_CAPSULE | Freq: Every day | ORAL | 12 refills | Status: DC

## 2015-11-14 ENCOUNTER — Encounter: Payer: Self-pay | Admitting: Neurology

## 2015-11-14 ENCOUNTER — Ambulatory Visit (INDEPENDENT_AMBULATORY_CARE_PROVIDER_SITE_OTHER): Payer: Managed Care, Other (non HMO) | Admitting: Neurology

## 2015-11-14 VITALS — BP 100/60 | HR 90 | Ht 63.0 in | Wt 126.0 lb

## 2015-11-14 DIAGNOSIS — G43701 Chronic migraine without aura, not intractable, with status migrainosus: Secondary | ICD-10-CM | POA: Diagnosis not present

## 2015-11-14 DIAGNOSIS — G43011 Migraine without aura, intractable, with status migrainosus: Secondary | ICD-10-CM | POA: Diagnosis not present

## 2015-11-14 MED ORDER — DIHYDROERGOTAMINE MESYLATE 1 MG/ML IJ SOLN
0.5000 mg | Freq: Once | INTRAMUSCULAR | Status: AC
  Administered 2015-11-14: 0.5 mg via INTRAVENOUS

## 2015-11-14 MED ORDER — DIHYDROERGOTAMINE MESYLATE 1 MG/ML IJ SOLN
0.5000 mg | Freq: Once | INTRAMUSCULAR | Status: AC
  Administered 2015-11-14: 0.5 mg via INTRAMUSCULAR

## 2015-11-14 NOTE — Patient Instructions (Addendum)
Remember to drink plenty of fluid, eat healthy meals and do not skip any meals. Try to eat protein with a every meal and eat a healthy snack such as fruit or nuts in between meals. Try to keep a regular sleep-wake schedule and try to exercise daily, particularly in the form of walking, 20-30 minutes a day, if you can.   I would like to see you back in 6 months, sooner if we need to. Please call us with any interim questions, concerns, problems, updates or refill requests.   Our phone number is [phone]. We also have an after hours call service for urgent matters and there is a physician on-call for urgent questions. For any emergencies you know to call 911 or go to the nearest emergency room   

## 2015-11-14 NOTE — Progress Notes (Signed)
Pt in office for f/u. Verified allergies w/ pt, name, DOB.24G IV started in Left AC at 1538. Attempts: 1 attempt. Cleaned with chlorhexidine prior to insertion. Site clean, dry and intact. Pt vitals stable prior to infusion.  Headache rated at "4/10" on pain scale.  Pt declined compazine at time of infusion.  Gave IV push ketorolac  over 2 minutes (lot: [id number], expiration:August 02, 2016, NDC: [phone]). Diluted with saline flush. Flushed before Depacon push. IV push Depacon 500mg /40ml x2 over 5 minutes (lot: [id number], expiration: 12/2017, NDCEK:[id number]). Flushed after Depacon IV push. Gave IVPB Soulmedrol 500mg  (lot: [id number], expiration:02/2019,  NDC: [id number]). over 30 minutes w/ 100 mL 0.9% sodium chloride bag (Lot:[id number], expiration 01/02/17, NDC: [id number]).   Infusion stopped at 1630. D/C'd IV in left AC. Catheter tip intact upon removal. Pt tolerated well. IV site clean and intact. No redness. Pt rated headache at " 1/10" on pain scale.   Vitals taken at 1635. BP 109/58, pulse 81, and temperature 97.5 degrees. Patient husband will be driving patient home. Pt stable upon leaving office.   Gave DHE 1mg /mL  IM in right deltoid at 1620. Cleaned with alcohol wipe prior to injection. Band-aid applied. Pt tolerated well. Gave DHE 1mg /mL IM in left deltoid at 1640. Cleaned with alcohol wipe prior to injection. Band-aid applied. Pt tolerated well.

## 2015-11-14 NOTE — Progress Notes (Signed)
[facility]    Provider:  Dr Jaynee Eagles Referring Provider: Yvone Neu Primary Care Physician:  Yvone Neu, MD  CC: Migraines  Interval History 11/14/2015: Patient is here for evaluation of migraine since August 9th. She has an extensive history since the age of 45 and is currently receiving botox therapy(see HPI below). The migraine has been waxing and waning but continuous.  Patient has failed a plethora of medications in the past (see below at original HPI for list), tried multiple abortive therapies, she has been here in clinic for migraine cocktails(depakote, steroids, compazine, toradol)  as well as IM DHE and lidocaine/marcaine nerve blocks.  We have prescribed multiple medications including: (Migrainal, Naproxen, Zofran, DHE, Meloxicam, Imitrex in various formulations including Onzetra, Relpax, Frova , baclofen, Stadol, methergine, prednisone packs, magnesium).  She has had a headache since August 9th and nothing is helping. Have advised her to be aware of medication overuse headache, patient is very educated and high functioning and she is very aware of rebound headache. Husband is an Forensic psychologist.  She was in bed all day yesterday. She also has an extensive history of failed medications (please see below) and we have recently initiated Trokendi again in the hopes she may respond. I suggested Depakote as this is something she has never tried but she is uncomfortable with the side effects.  Discussed the following possible changes to her birth control as well ans we have discussed referral to headache clinic:  Effective COCs for MM or MRM reduce the fall in estrogen to 10ucg or less Specific formulations include: Lybrel (365d) which is now generic as Set designer (generic w more breakthough bleeding) Lo-Seasonique 84d(regular seasonique has a drop of 87mcg from 30 to 28mcg which is too much) Lo loestrin 1/10 FE NuvaRing 46mcg, keep in 4wk, then replace. If  wants to cycle, can use a 0.075mg  estrogen patch during the week it is out   Keep in mind that the pill is still daily spikes of estrogen at the time you take the pill NuvaRing provides the most steady, consistent estrogen levels  Intreval history 08/04/2014: April 25th headaches worsened. Nothing happened around that time. She has 2 menses in the month of may. She has had a migraine almost every day since then, status migrainosus. She takes imitrex with naproxen at the onset of her headache. She has tried [place] zembrace and other triptans and they don't work any better than   Interval update 02/13/2015: She is feeling better. The frequency and severity has decreased. She is coming back January 9th for botox. Discussed cgrp trial. But she has a low-grade headache daily. Responsive to sumatriptan. No medication overuse.  HPI 01/10/2015: Jenna Fitzgerald is a 45 y.o. female here as a referral from Dr. Zigmund Daniel for migraines. She has had them since she was 24. She has had them daily, at one point 18 months continuously. Botox helped in the past as did progesterone. She has had Botox every 3 months for 2 years. She takes progesterone. She needs occipital nerve blocks as needed, botox every 3 months, meloxicam daily. In may she was doing well and stopped her BCP and lexapro and then the migraines worsened. She is still in the cycle of trying to get her headaches back in control. On the botox she was down to 2 headaches a month but now having daily migraines, they can last up to 24 hours, She has light and sound sensitivity, she has left sided throbbing/pulsating, no daily OTC medications or overuse component.  The daily headaches since June. She has failed Topiramate , gabapentin, meloxicam, propranolol, topiramate. She did not tolerate propranolol. She is currently on Lexapro. The last time she had botox was October 13th. She has nausea, no vomiting with the headaches, can be up to 8/10. Imitrex sometimes  works. Husband has had vasctomy so she is not planning on anymore children. She has 30 headache days per month all of which are migrainous and last up to 24 hours. No aura. She also has fatigue and depression. She does not use caffeine, she keeps a headache diary without food triggers noted, she uses Cephaly daily and does yoga and cardio.  She is on Verapamil, lexapro, imitrex oral.   Reviewed notes, labs and imaging from outside physicians, which showed: headaches began at the age of 37 during her menses. Gradually increased to daily migraines and once had them for 18 months consecutively. In June 2016 she stopped the lexapro and BCP and migraines worsened from 2/month to 30.month.  July 31 restarted BCP.  Aug 15 restarted lexapro and noticed improvement.  Last botox October 13 Oct 15: changed from bcp to progesterone, stopped caffeine. Daily cephaly use.  meds tried before: Amitriptyline  Anaprox 550 fioricet Diazepam 10mg  camrese cephadyn Codeine Gabapentin 600mg  introvale jolessa Melatonin meloxicam Methylergonovine migra-eeze prometrium Propranolol topiramate Tri-sprintec trokendi c blocks, trigger points, trigeninal blocks, occipital nerve block  11/16/2012: MRi norma 01/05/2007: carotids and echo nml Review of Systems: Patient complains of symptoms per HPI as well as the following symptoms: No CP, no SOB. Pertinent negatives per HPI. All others negative.   Social History   Social History  . Marital status: Married    Spouse name: Phineas Douglas"   . Number of children: 1  . Years of education: 35   Occupational History  . [employer]    Social History Main Topics  . Smoking status: Former Smoker    Quit date: 03/04/2004  . Smokeless tobacco: Never Used  . Alcohol use 0.0 oz/week     Comment: Rare  . Drug use: No  . Sexual activity: Not on file   Other Topics Concern  . Not on file   Social History Narrative   Lives at home with husband  and son   Caffeine use:  Drinks decaf drinks           Family History  Problem Relation Age of Onset  . Diabetes Father   . Non-Hodgkin's lymphoma Mother     Pancreas  . Migraines Neg Hx     Past Medical History:  Diagnosis Date  . Cervical syndrome   . Cervicocranial syndrome   . Chronic mixed headache syndrome   . Gastroenteritis   . Irregular periods   . Menopausal syndrome   . Neck pain   . Premenstrual tension syndrome   . Refractory migraine   . Trigeminal neuralgia     Past Surgical History:  Procedure Laterality Date  . SYMPATHECTOMY Bilateral     Current Outpatient Prescriptions  Medication Sig Dispense Refill  . dihydroergotamine (MIGRANAL) 4 MG/ML nasal spray Place 1 spray into the nose as needed for migraine. Use in one nostril as directed.  No more than 4 sprays in one hour 8 mL 12  . escitalopram (LEXAPRO) 10 MG tablet Take 1 tablet (10 mg total) by mouth daily. 30 tablet 12  . MAGNESIUM CITRATE PO Take 800 mg by mouth daily.    . meloxicam (MOBIC) 15 MG tablet Take 1 tablet (15  mg total) by mouth daily. 30 tablet 12  . Multiple Vitamins-Minerals (MULTIVITAMIN ADULT PO) Take 1 tablet by mouth daily.    . naproxen sodium (ANAPROX) 550 MG tablet Take 1 tablet (550 mg total) by mouth every 12 (twelve) hours as needed. 60 tablet 12  . OnabotulinumtoxinA (BOTOX IJ) Inject 1 Dose as directed every 3 (three) months.    . ondansetron (ZOFRAN ODT) 4 MG disintegrating tablet Take 1 tablet (4 mg total) by mouth every 8 (eight) hours as needed for nausea or vomiting. 20 tablet 12  . progesterone (PROMETRIUM) 100 MG capsule Take 3 capsules by mouth daily.  12  . SUMAtriptan (IMITREX) 100 MG tablet Take 1 tablet (100 mg total) by mouth 2 (two) times daily as needed. 30 tablet 12  . Topiramate ER 50 MG CP24 Take 50 mg by mouth at bedtime. 30 capsule 12  . verapamil (VERELAN PM) 180 MG 24 hr capsule Take 1 capsule (180 mg total) by mouth at bedtime. 30 capsule 12   No  current facility-administered medications for this visit.     Allergies as of 11/14/2015 - Review Complete 11/14/2015  Allergen Reaction Noted  . Stadol [butorphanol]  11/14/2015    Vitals: BP 100/60 (BP Location: Right Arm, Patient Position: Sitting, Cuff Size: Normal)   Pulse 90   Ht 5\' 3"  (1.6 m)   Wt 126 lb (57.2 kg)   BMI 22.32 kg/m  Last Weight:  Wt Readings from Last 1 Encounters:  11/14/15 126 lb (57.2 kg)   Last Height:   Ht Readings from Last 1 Encounters:  11/14/15 5\' 3"  (1.6 m)   Physical exam: Exam: Gen: NAD, conversant, well nourised, obese, well groomed                     CV: RRR, no MRG. No Carotid Bruits. No peripheral edema, warm, nontender Eyes: Conjunctivae clear without exudates or hemorrhage  Neuro: Detailed Neurologic Exam  Speech:    Speech is normal; fluent and spontaneous with normal comprehension.  Cognition:    The patient is oriented to person, place, and time;     recent and remote memory intact;     language fluent;     normal attention, concentration,     fund of knowledge Cranial Nerves:    The pupils are equal, round, and reactive to light. The fundi are normal and spontaneous venous pulsations are present. Visual fields are full to finger confrontation. Extraocular movements are intact. Trigeminal sensation is intact and the muscles of mastication are normal. The face is symmetric. The palate elevates in the midline. Hearing intact. Voice is normal. Shoulder shrug is normal. The tongue has normal motion without fasciculations.   Coordination:    Normal finger to nose and heel to shin. Normal rapid alternating movements.   Gait:    Heel-toe and tandem gait are normal.   Motor Observation:    No asymmetry, no atrophy, and no involuntary movements noted. Tone:    Normal muscle tone.    Posture:    Posture is normal. normal erect    Strength:    Strength is V/V in the upper and lower limbs.      Sensation: intact to LT       Reflex Exam:  DTR's:    Deep tendon reflexes in the upper and lower extremities are normal bilaterally.   Toes:    The toes are downgoing bilaterally.   Clonus:    Clonus is absent.  Assessment/Plan:  Lovely high-functioning 45 year old with intractable migraines, tried and failed over 30 preventative and aborive medications. Today she comes back with intractable headache/migraine for over 30 days again. We'll perform migraine cocktail and DHE injection 1/2 mL in each arm the deltoid with a vital signs recorded before.  See details on all medications tried and failed in interval history today and also original HPI which is extensive.   Is on botox every 3 months.   Migraines: Continue botox,lexapro, yoga, meditation, cephaly. Verapamil, topiramate. Discussed Depakote.  Depression: continue Lexapro Fatigue: discussed good sleep hygiene Referral to headache clinic and chiropractor  Effective COCs for MM or MRM reduce the fall in estrogen to 10ucg or less Specific formulations include: Lybrel (365d) which is now generic as Set designer (generic w more breakthough bleeding) Lo-Seasonique 84d(regular seasonique has a drop of 68mcg from 30 to 36mcg which is too much) Lo loestrin 1/10 FE NuvaRing 65mcg, keep in 4wk, then replace. If wants to cycle, can use a 0.075mg  estrogen patch during the week it is out  Keep in mind that the pill is still daily spikes of estrogen at the time you take the pill NuvaRing provides the most steady, consistent estrogen levels Discuss with OB/GYN  Sarina Ill, MD  [facility] [address] [place], [place] [address]  Phone [phone] Fax [phone]  A total of 45 minutes was spent face-to-face with this patient. Over half this time was spent on counseling patient on the intractable migraine diagnosis and different diagnostic and therapeutic options available.    Sarina Ill, MD  [facility] [address] [place], [place] [address]

## 2015-11-15 ENCOUNTER — Encounter: Payer: Self-pay | Admitting: *Deleted

## 2015-11-15 ENCOUNTER — Telehealth: Payer: Self-pay | Admitting: *Deleted

## 2015-11-15 ENCOUNTER — Encounter: Payer: Self-pay | Admitting: Neurology

## 2015-11-15 NOTE — Progress Notes (Signed)
Faxed sprix order form to [facility]. Fax: [phone]. Received confirmation.

## 2015-11-15 NOTE — Telephone Encounter (Signed)
Called patient and schedule f/u for nerve block Friday at 12pm, check in 1145am. She verbalized understanding.

## 2015-11-15 NOTE — Telephone Encounter (Signed)
Dr Jaynee Eagles- I saw pt replied to your mychart message. I called patient and scheduled nerve block for Friday at 12pm FYI. Thank you

## 2015-11-17 ENCOUNTER — Encounter: Payer: Self-pay | Admitting: Neurology

## 2015-11-17 ENCOUNTER — Ambulatory Visit (INDEPENDENT_AMBULATORY_CARE_PROVIDER_SITE_OTHER): Payer: Self-pay | Admitting: Neurology

## 2015-11-17 VITALS — BP 109/55 | HR 80 | Ht 63.0 in | Wt 125.6 lb

## 2015-11-17 DIAGNOSIS — R51 Headache: Secondary | ICD-10-CM

## 2015-11-17 DIAGNOSIS — R519 Headache, unspecified: Secondary | ICD-10-CM

## 2015-11-17 NOTE — Progress Notes (Signed)
  Discussed injections today will hold off and cancel patient's appointment and refer to [place] and Dr. Sima Matas in [place]

## 2015-11-19 ENCOUNTER — Encounter: Payer: Self-pay | Admitting: Neurology

## 2015-11-21 DIAGNOSIS — G43919 Migraine, unspecified, intractable, without status migrainosus: Secondary | ICD-10-CM | POA: Insufficient documentation

## 2015-11-24 ENCOUNTER — Encounter: Payer: Self-pay | Admitting: Neurology

## 2015-11-27 ENCOUNTER — Encounter: Payer: Self-pay | Admitting: Neurology

## 2015-11-27 ENCOUNTER — Other Ambulatory Visit: Payer: Self-pay | Admitting: Neurology

## 2015-11-27 MED ORDER — TOPIRAMATE ER 100 MG PO CAP24: 100.0000 mg | ORAL_CAPSULE | Freq: Every day | ORAL | 12 refills | Status: DC

## 2015-12-11 ENCOUNTER — Encounter: Payer: Self-pay | Admitting: Neurology

## 2015-12-12 ENCOUNTER — Other Ambulatory Visit: Payer: Self-pay | Admitting: Neurology

## 2015-12-13 ENCOUNTER — Telehealth: Payer: Self-pay | Admitting: Neurology

## 2015-12-13 NOTE — Telephone Encounter (Signed)
,   can you call and se eif she can come in for migraine cocktail? Then I can also provide DHE as well as trigger point injections if needed.

## 2015-12-13 NOTE — Telephone Encounter (Signed)
Called and spoke to pt. She will come today at 220pm for migraine infusion per Dr Jaynee Eagles. Advised I will inform Jenna Fitzgerald in intrafusion.

## 2015-12-16 ENCOUNTER — Encounter: Payer: Self-pay | Admitting: Neurology

## 2015-12-21 ENCOUNTER — Ambulatory Visit: Payer: Managed Care, Other (non HMO) | Admitting: Neurology

## 2015-12-28 ENCOUNTER — Ambulatory Visit (INDEPENDENT_AMBULATORY_CARE_PROVIDER_SITE_OTHER): Payer: Managed Care, Other (non HMO) | Admitting: Neurology

## 2015-12-28 VITALS — BP 105/64 | HR 80

## 2015-12-28 DIAGNOSIS — G43701 Chronic migraine without aura, not intractable, with status migrainosus: Secondary | ICD-10-CM | POA: Diagnosis not present

## 2015-12-28 NOTE — Progress Notes (Signed)
Botox-100unitsx2 vials Lot: [id number] Expiration: 07/2018 NDC: [id number]  0.9% Sodium Chloride- 63mL total [id number] Expiration: 07/2017 NDC: [id number]  *Specialty pharmacy, dx: [id number]

## 2015-12-28 NOTE — Progress Notes (Signed)
Consent Form Botulism Toxin Injection For Chronic Migraine  Botulism toxin has been approved by the Federal drug administration for treatment of chronic migraine. Botulism toxin does not cure chronic migraine and it may not be effective in some patients.  The administration of botulism toxin is accomplished by injecting a small amount of toxin into the muscles of the neck and head. Dosage must be titrated for each individual. Any benefits resulting from botulism toxin tend to wear off after 3 months with a repeat injection required if benefit is to be maintained. Injections are usually done every 3-4 months with maximum effect peak achieved by about 2 or 3 weeks. Botulism toxin is expensive and you should be sure of what costs you will incur resulting from the injection.  The side effects of botulism toxin use for chronic migraine may include:              -Transient, and usually mild, facial weakness with facial injections             -Transient, and usually mild, head or neck weakness with head/neck injections             -Reduction or loss of forehead facial animation due to forehead muscle  weakness             -Eyelid drooping             -Dry eye             -Pain at the site of injection or bruising at the site of injection             -Double vision             -Potential unknown long term risks  Contraindications: You should not have Botox if you are pregnant, nursing, allergic to albumin, have an infection, skin condition, or muscle weakness at the site of the injection, or have myasthenia gravis, Lambert-Eaton syndrome, or ALS.  It is also possible that as with any injection, there may be an allergic reaction or no effect from the medication. Reduced effectiveness after repeated injections is sometimes seen and rarely infection at the injection site may occur. All care will be taken to prevent these side effects. If therapy is given over a long time, atrophy and wasting in the  muscle injected may occur. Occasionally the patient's become refractory to treatment because they develop antibodies to the toxin. In this event, therapy needs to be modified.  I have read the above information and consent to the administration of botulism toxin.               ______________                    _____                          _________________             Patient signature                     Date                             Witness signature       BOTOX PROCEDURE NOTE FOR MIGRAINE HEADACHE    Contraindications and precautions discussed with patient(above). Aseptic procedure was observed and patient tolerated procedure. Procedure performed by Dr. Georgia Dom  The condition has existed for more than 6 months, and pt does not have a diagnosis of ALS, Myasthenia Gravis or Lambert-Eaton Syndrome. Risks and benefits of injections discussed and pt agrees to proceed with the procedure. Written consent obtained  These injections are medically necessary. He receives good benefits from these injections. These injections do not cause sedations or hallucinations which the oral therapies may cause.  Indication/Diagnosis: chronic migraine BOTOX(J0585) injection was performed according to protocol by [employer]. 200 units of BOTOX was dissolved into 4 cc NS.  NDC: [address]-02  Botox-100unitsx2 vials Lot: [id number] Expiration: 07/2018 NDC: [id number]  0.9% Sodium Chloride- 85mL total [id number] Expiration: 07/2017 NDC: [id number]  Description of procedure:  The patient was placed in a sitting position. The standard protocol was used for Botox as follows, with 5 units of Botox injected at each site:   -Procerus muscle, midline injection  -Corrugator muscle, bilateral injection  -Frontalis muscle, bilateral injection, with 2 sites each side, medial injection was performed in the upper one third of the frontalis muscle, in the region  vertical from the medial inferior edge of the superior orbital rim. The lateral injection was again in the upper one third of the forehead vertically above the lateral limbus of the cornea, 1.5 cm lateral to the medial injection site.  -Temporalis muscle injection, 4 sites, bilaterally. The first injection was 3 cm above the tragus of the ear, second injection site was 1.5 cm to 3 cm up from the first injection site in line with the tragus of the ear. The third injection site was 1.5-3 cm forward between the first 2 injection sites. The fourth injection site was 1.5 cm posterior to the second injection site.  -Occipitalis muscle injection, 3 sites, bilaterally. The first injection was done one half way between the occipital protuberance and the tip of the mastoid process behind the ear. The second injection site was done lateral and superior to the first, 1 fingerbreadth from the first injection. The third injection site was 1 fingerbreadth superiorly and medially from the first injection site.  -Cervical paraspinal muscle injection, 2 sites, bilateral knee first injection site was 1 cm from the midline of the cervical spine, 3 cm inferior to the lower border of the occipital protuberance. The second injection site was 1.5 cm superiorly and laterally to the first injection site.  -Trapezius muscle injection was performed at 3 sites, bilaterally with 10 units at each site. The first injection site was in the upper trapezius muscle halfway between the inflection point of the neck, and the acromion. The second injection site was one half way between the acromion and the first injection site. The third injection was done between the first injection site and the inflection point of the neck.   Will return for repeat injection in 3 months.   A 200 unit sof Botox was used, 195 units were injected, the rest of the Botox was wasted. The patient tolerated the procedure well, there were no complications  of the above procedure.  Additional units in the trapezius muscles and an additional near the temple on the left side 5 units

## 2016-01-06 ENCOUNTER — Encounter: Payer: Self-pay | Admitting: Neurology

## 2016-01-10 ENCOUNTER — Other Ambulatory Visit: Payer: Self-pay | Admitting: *Deleted

## 2016-01-10 MED ORDER — VERAPAMIL HCL ER 180 MG PO CP24: 180.0000 mg | ORAL_CAPSULE | Freq: Every day | ORAL | 3 refills | Status: DC

## 2016-01-10 NOTE — Telephone Encounter (Signed)
Called pt. Advised I received fax request for 90 day supply for trokendi XR 100mg  and 50mg  capsule. She verified she is NOT taking this and does not need a refill. She never requested 90 day supply. Advised it could have been an automated thing through the pharmacy.   She states she would like to have 90 day supply for verapamil. Advised I can send that for her. She verbalized understanding.   Sent rx for 90 day supply electronically to pharmacy.  She is wondering if she should still have MRI. Since message to Dr Jaynee Eagles last week, she has had more good days  Than bad. She has appt with Dr Bonita Quin week after thanksgiving and appt with Dr Francene Boyers in January.

## 2016-01-31 ENCOUNTER — Encounter: Payer: Self-pay | Admitting: Neurology

## 2016-02-08 ENCOUNTER — Encounter: Payer: Self-pay | Admitting: Neurology

## 2016-02-19 ENCOUNTER — Encounter: Payer: Self-pay | Admitting: Neurology

## 2016-03-05 ENCOUNTER — Encounter: Payer: Self-pay | Admitting: Neurology

## 2016-03-11 ENCOUNTER — Encounter: Payer: Self-pay | Admitting: *Deleted

## 2016-03-18 ENCOUNTER — Encounter: Payer: Self-pay | Admitting: Neurology

## 2016-03-25 ENCOUNTER — Ambulatory Visit (INDEPENDENT_AMBULATORY_CARE_PROVIDER_SITE_OTHER): Payer: Managed Care, Other (non HMO) | Admitting: Neurology

## 2016-03-25 ENCOUNTER — Encounter: Payer: Self-pay | Admitting: Neurology

## 2016-03-25 VITALS — BP 104/48 | HR 71 | Ht 63.0 in

## 2016-03-25 DIAGNOSIS — G43701 Chronic migraine without aura, not intractable, with status migrainosus: Secondary | ICD-10-CM

## 2016-03-25 NOTE — Progress Notes (Signed)
Botox-200unitsx1vials Lot: [id number] Expiration: 10/2018 NDC: [id number]  0.9% Sodium Chloride bacteriostatic- 3mL total Lot: [id number] Expiration: 08/02/2017 NDC: [id number]  Dx: [id number] Specialty

## 2016-03-26 ENCOUNTER — Encounter: Payer: Self-pay | Admitting: Neurology

## 2016-03-26 NOTE — Progress Notes (Signed)

## 2016-05-14 ENCOUNTER — Ambulatory Visit: Payer: Managed Care, Other (non HMO) | Admitting: Neurology

## 2016-06-23 ENCOUNTER — Encounter: Payer: Self-pay | Admitting: Neurology

## 2016-06-24 NOTE — Telephone Encounter (Signed)
Pt had last Botox 03/25/16 for migraines and is scheduled on Wed 06/26/16 for next treatment.

## 2016-06-25 ENCOUNTER — Encounter: Payer: Self-pay | Admitting: Neurology

## 2016-06-25 ENCOUNTER — Other Ambulatory Visit: Payer: Self-pay | Admitting: Neurology

## 2016-06-25 DIAGNOSIS — R51 Headache: Principal | ICD-10-CM

## 2016-06-25 DIAGNOSIS — G8929 Other chronic pain: Secondary | ICD-10-CM

## 2016-06-26 ENCOUNTER — Ambulatory Visit (INDEPENDENT_AMBULATORY_CARE_PROVIDER_SITE_OTHER): Payer: Managed Care, Other (non HMO) | Admitting: Neurology

## 2016-06-26 VITALS — BP 97/61 | HR 66 | Ht 63.0 in | Wt 126.2 lb

## 2016-06-26 DIAGNOSIS — G43711 Chronic migraine without aura, intractable, with status migrainosus: Secondary | ICD-10-CM

## 2016-06-26 MED ORDER — FLUOXETINE HCL 20 MG PO CAPS: 20.0000 mg | ORAL_CAPSULE | Freq: Every day | ORAL | 6 refills | Status: DC

## 2016-06-26 MED ORDER — MEMANTINE HCL 10 MG PO TABS: 10.0000 mg | ORAL_TABLET | Freq: Two times a day (BID) | ORAL | 12 refills | Status: DC

## 2016-06-26 NOTE — Progress Notes (Signed)
Botox 200 units/ml vial from x 2 vials from [facility] [id number] Lot [id number] Exp 08 2020  Diluted in 4 ml of Bacteriostatic 0.9% NaCl NDC 3235-5732-20 Lot [id number]

## 2016-06-26 NOTE — Patient Instructions (Signed)
Remember to drink plenty of fluid, eat healthy meals and do not skip any meals. Try to eat protein with a every meal and eat a healthy snack such as fruit or nuts in between meals. Try to keep a regular sleep-wake schedule and try to exercise daily, particularly in the form of walking, 20-30 minutes a day, if you can.   As far as your medications are concerned, I would like to suggest: Prozac, Namenda  MRI brain  I would like to see you back in 1 month, sooner if we need to. Please call us with any interim questions, concerns, problems, updates or refill requests.   Our phone number is [phone]. We also have an after hours call service for urgent matters and there is a physician on-call for urgent questions. For any emergencies you know to call 911 or go to the nearest emergency room  Memantine Tablets What is this medicine? MEMANTINE (MEM an teen) is used to treat dementia caused by Alzheimer's disease. This medicine may be used for other purposes; ask your health care provider or pharmacist if you have questions. COMMON BRAND NAME(S): Namenda What should I tell my health care provider before I take this medicine? They need to know if you have any of these conditions: -difficulty passing urine -kidney disease -liver disease -seizures -an unusual or allergic reaction to memantine, other medicines, foods, dyes, or preservatives -pregnant or trying to get pregnant -breast-feeding How should I use this medicine? Take this medicine by mouth with a glass of water. Follow the directions on the prescription label. You may take this medicine with or without food. Take your doses at regular intervals. Do not take your medicine more often than directed. Continue to take your medicine even if you feel better. Do not stop taking except on the advice of your doctor or health care professional. Talk to your pediatrician regarding the use of this medicine in children. Special care may be  needed. Overdosage: If you think you have taken too much of this medicine contact a poison control center or emergency room at once. NOTE: This medicine is only for you. Do not share this medicine with others. What if I miss a dose? If you miss a dose, take it as soon as you can. If it is almost time for your next dose, take only that dose. Do not take double or extra doses. If you do not take your medicine for several days, contact your health care provider. Your dose may need to be changed. What may interact with this medicine? -acetazolamide -amantadine -cimetidine -dextromethorphan -dofetilide -hydrochlorothiazide -ketamine -metformin -methazolamide -quinidine -ranitidine -sodium bicarbonate -triamterene This list may not describe all possible interactions. Give your health care provider a list of all the medicines, herbs, non-prescription drugs, or dietary supplements you use. Also tell them if you smoke, drink alcohol, or use illegal drugs. Some items may interact with your medicine. What should I watch for while using this medicine? Visit your doctor or health care professional for regular checks on your progress. Check with your doctor or health care professional if there is no improvement in your symptoms or if they get worse. You may get drowsy or dizzy. Do not drive, use machinery, or do anything that needs mental alertness until you know how this drug affects you. Do not stand or sit up quickly, especially if you are an older patient. This reduces the risk of dizzy or fainting spells. Alcohol can make you more drowsy and dizzy. Avoid alcoholic  drinks. What side effects may I notice from receiving this medicine? Side effects that you should report to your doctor or health care professional as soon as possible: -allergic reactions like skin rash, itching or hives, swelling of the face, lips, or tongue -agitation or a feeling of restlessness -depressed  mood -dizziness -hallucinations -redness, blistering, peeling or loosening of the skin, including inside the mouth -seizures -vomiting Side effects that usually do not require medical attention (report to your doctor or health care professional if they continue or are bothersome): -constipation -diarrhea -headache -nausea -trouble sleeping This list may not describe all possible side effects. Call your doctor for medical advice about side effects. You may report side effects to FDA at 1-800-FDA-1088. Where should I keep my medicine? Keep out of the reach of children. Store at room temperature between 15 degrees and 30 degrees C (59 degrees and 86 degrees F). Throw away any unused medicine after the expiration date. NOTE: This sheet is a summary. It may not cover all possible information. If you have questions about this medicine, talk to your doctor, pharmacist, or health care provider.  2018 Elsevier/Gold Standard (2012-12-07 14:10:42)  Fluoxetine capsules or tablets (Depression/Mood Disorders) What is this medicine? FLUOXETINE (floo OX e teen) belongs to a class of drugs known as selective serotonin reuptake inhibitors (SSRIs). It helps to treat mood problems such as depression, obsessive compulsive disorder, and panic attacks. It can also treat certain eating disorders. This medicine may be used for other purposes; ask your health care provider or pharmacist if you have questions. COMMON BRAND NAME(S): Prozac What should I tell my health care provider before I take this medicine? They need to know if you have any of these conditions: -bipolar disorder or a family history of bipolar disorder -bleeding disorders -glaucoma -heart disease -liver disease -low levels of sodium in the blood -seizures -suicidal thoughts, plans, or attempt; a previous suicide attempt by you or a family member -take MAOIs like Carbex, Eldepryl, Marplan, Nardil, and Parnate -take medicines that treat or  prevent blood clots -thyroid disease -an unusual or allergic reaction to fluoxetine, other medicines, foods, dyes, or preservatives -pregnant or trying to get pregnant -breast-feeding How should I use this medicine? Take this medicine by mouth with a glass of water. Follow the directions on the prescription label. You can take this medicine with or without food. Take your medicine at regular intervals. Do not take it more often than directed. Do not stop taking this medicine suddenly except upon the advice of your doctor. Stopping this medicine too quickly may cause serious side effects or your condition may worsen. A special MedGuide will be given to you by the pharmacist with each prescription and refill. Be sure to read this information carefully each time. Talk to your pediatrician regarding the use of this medicine in children. While this drug may be prescribed for children as young as 7 years for selected conditions, precautions do apply. Overdosage: If you think you have taken too much of this medicine contact a poison control center or emergency room at once. NOTE: This medicine is only for you. Do not share this medicine with others. What if I miss a dose? If you miss a dose, skip the missed dose and go back to your regular dosing schedule. Do not take double or extra doses. What may interact with this medicine? Do not take this medicine with any of the following medications: -other medicines containing fluoxetine, like Sarafem or Symbyax -cisapride -linezolid -MAOIs  like Carbex, Eldepryl, Marplan, Nardil, and Parnate -methylene blue (injected into a vein) -pimozide -thioridazine This medicine may also interact with the following medications: -alcohol -amphetamines -aspirin and aspirin-like medicines -carbamazepine -certain medicines for depression, anxiety, or psychotic disturbances -certain medicines for migraine headaches like almotriptan, eletriptan, frovatriptan,  naratriptan, rizatriptan, sumatriptan, zolmitriptan -digoxin -diuretics -fentanyl -flecainide -furazolidone -isoniazid -lithium -medicines for sleep -medicines that treat or prevent blood clots like warfarin, enoxaparin, and dalteparin -NSAIDs, medicines for pain and inflammation, like ibuprofen or naproxen -phenytoin -procarbazine -propafenone -rasagiline -ritonavir -supplements like St. John's wort, kava kava, valerian -tramadol -tryptophan -vinblastine This list may not describe all possible interactions. Give your health care provider a list of all the medicines, herbs, non-prescription drugs, or dietary supplements you use. Also tell them if you smoke, drink alcohol, or use illegal drugs. Some items may interact with your medicine. What should I watch for while using this medicine? Tell your doctor if your symptoms do not get better or if they get worse. Visit your doctor or health care professional for regular checks on your progress. Because it may take several weeks to see the full effects of this medicine, it is important to continue your treatment as prescribed by your doctor. Patients and their families should watch out for new or worsening thoughts of suicide or depression. Also watch out for sudden changes in feelings such as feeling anxious, agitated, panicky, irritable, hostile, aggressive, impulsive, severely restless, overly excited and hyperactive, or not being able to sleep. If this happens, especially at the beginning of treatment or after a change in dose, call your health care professional. Dennis Bast may get drowsy or dizzy. Do not drive, use machinery, or do anything that needs mental alertness until you know how this medicine affects you. Do not stand or sit up quickly, especially if you are an older patient. This reduces the risk of dizzy or fainting spells. Alcohol may interfere with the effect of this medicine. Avoid alcoholic drinks. Your mouth may get dry. Chewing  sugarless gum or sucking hard candy, and drinking plenty of water may help. Contact your doctor if the problem does not go away or is severe. This medicine may affect blood sugar levels. If you have diabetes, check with your doctor or health care professional before you change your diet or the dose of your diabetic medicine. What side effects may I notice from receiving this medicine? Side effects that you should report to your doctor or health care professional as soon as possible: -allergic reactions like skin rash, itching or hives, swelling of the face, lips, or tongue -anxious -black, tarry stools -breathing problems -changes in vision -confusion -elevated mood, decreased need for sleep, racing thoughts, impulsive behavior -eye pain -fast, irregular heartbeat -feeling faint or lightheaded, falls -feeling agitated, angry, or irritable -hallucination, loss of contact with reality -loss of balance or coordination -loss of memory -painful or prolonged erections -restlessness, pacing, inability to keep still -seizures -stiff muscles -suicidal thoughts or other mood changes -trouble sleeping -unusual bleeding or bruising -unusually weak or tired -vomiting Side effects that usually do not require medical attention (report to your doctor or health care professional if they continue or are bothersome): -change in appetite or weight -change in sex drive or performance -diarrhea -dry mouth -headache -increased sweating -nausea -tremors This list may not describe all possible side effects. Call your doctor for medical advice about side effects. You may report side effects to FDA at 1-800-FDA-1088. Where should I keep my medicine? Keep out of  the reach of children. Store at room temperature between 15 and 30 degrees C (59 and 86 degrees F). Throw away any unused medicine after the expiration date. NOTE: This sheet is a summary. It may not cover all possible information. If you have  questions about this medicine, talk to your doctor, pharmacist, or health care provider.  2018 Elsevier/Gold Standard (2015-07-22 15:55:27)

## 2016-06-26 NOTE — Progress Notes (Signed)

## 2016-06-28 ENCOUNTER — Telehealth: Payer: Self-pay | Admitting: Neurology

## 2016-06-28 NOTE — Telephone Encounter (Signed)
I called the patient to inform her the situation with her MRI.. I informed her that it is still pending for the authorization for the MRI and the reason she got a call from [facility] is because when I spoke to her insurance [employer] they told me [facility] was not in network for her to have the MRI. So I sent it to GI.Marland Kitchen I told her as soon as I hear from her insurance about the approval I would let her know.. She understood.

## 2016-07-02 NOTE — Telephone Encounter (Signed)
[employer] did not approve the MRI Brain. The phone number for the peer to peer is [phone]. The case number is [id number]. Her insurance information is Holland Falling and her ID number is [id number].

## 2016-07-03 ENCOUNTER — Telehealth: Payer: Self-pay | Admitting: Neurology

## 2016-07-03 ENCOUNTER — Encounter: Payer: Self-pay | Admitting: Neurology

## 2016-07-03 NOTE — Telephone Encounter (Signed)
Patient called office in reference to scheduling her next Botox appointment.  Please call

## 2016-07-04 NOTE — Telephone Encounter (Signed)
I called and scheduled patient next injection.

## 2016-07-10 ENCOUNTER — Ambulatory Visit: Payer: Self-pay | Admitting: Neurology

## 2016-07-11 NOTE — Telephone Encounter (Signed)
Denied unfortunately.

## 2016-07-15 NOTE — Telephone Encounter (Signed)
I called and spoke with the patient to let her know that her MRI had been denied, she stated that her and Dr. Jaynee Eagles had spoken already regarding this and she was going to just hold off on the MRI for now.

## 2016-07-15 NOTE — Telephone Encounter (Signed)
Noted, thank you

## 2016-07-21 ENCOUNTER — Encounter: Payer: Self-pay | Admitting: Neurology

## 2016-07-25 ENCOUNTER — Encounter: Payer: Self-pay | Admitting: Neurology

## 2016-07-30 ENCOUNTER — Encounter: Payer: Self-pay | Admitting: Neurology

## 2016-07-31 ENCOUNTER — Encounter: Payer: Self-pay | Admitting: Neurology

## 2016-08-02 NOTE — Telephone Encounter (Signed)
New med for prevention of migraines (erenumab) service request form and rx completed, signed and faxed to [employer].

## 2016-08-05 ENCOUNTER — Encounter: Payer: Self-pay | Admitting: Neurology

## 2016-08-09 ENCOUNTER — Telehealth: Payer: Self-pay | Admitting: Neurology

## 2016-08-09 NOTE — Telephone Encounter (Signed)
Judson Roch called regarding the PA request the patient has pending for medication rx AIMOVIG. Judson Roch would like a call regarding the PA status and would like to know if there is anything she can do to help. Please call and advise.

## 2016-08-12 ENCOUNTER — Encounter: Payer: Self-pay | Admitting: Neurology

## 2016-08-12 NOTE — Telephone Encounter (Signed)
Tried to call Jenna Fitzgerald back before leaving the office today but was unable to reach her. However, pt messaged this evening saying that medicine would be delivered to her home on Thursday of this week.

## 2016-08-15 ENCOUNTER — Encounter: Payer: Self-pay | Admitting: Neurology

## 2016-08-23 ENCOUNTER — Other Ambulatory Visit: Payer: Self-pay | Admitting: Neurology

## 2016-09-09 HISTORY — PX: LAPAROSCOPIC TOTAL HYSTERECTOMY: SUR800

## 2016-09-26 ENCOUNTER — Encounter: Payer: Self-pay | Admitting: Neurology

## 2016-09-26 ENCOUNTER — Ambulatory Visit (INDEPENDENT_AMBULATORY_CARE_PROVIDER_SITE_OTHER): Payer: Managed Care, Other (non HMO) | Admitting: Neurology

## 2016-09-26 VITALS — BP 111/69 | HR 89 | Resp 14 | Ht 63.0 in | Wt 126.0 lb

## 2016-09-26 DIAGNOSIS — G43711 Chronic migraine without aura, intractable, with status migrainosus: Secondary | ICD-10-CM | POA: Diagnosis not present

## 2016-09-28 NOTE — Progress Notes (Signed)

## 2016-10-02 ENCOUNTER — Telehealth: Payer: Self-pay | Admitting: *Deleted

## 2016-10-02 NOTE — Telephone Encounter (Signed)
Aimovig PA started on cover my meds.

## 2016-10-02 NOTE — Telephone Encounter (Signed)
Received fax from [employer] stating "due to a system error, a one-time override has been entered for the PA request. A PA may be required for this medication in the future."  PA information given to Anderson Malta, [place] for Dr Jaynee Eagles.

## 2016-10-10 ENCOUNTER — Encounter: Payer: Self-pay | Admitting: Neurology

## 2016-10-10 ENCOUNTER — Telehealth: Payer: Self-pay | Admitting: Neurology

## 2016-10-10 ENCOUNTER — Other Ambulatory Visit: Payer: Self-pay | Admitting: Neurology

## 2016-10-10 MED ORDER — CANDESARTAN CILEXETIL 4 MG PO TABS: 8.0000 mg | ORAL_TABLET | Freq: Every day | ORAL | 6 refills | Status: DC

## 2016-10-10 NOTE — Telephone Encounter (Signed)
I called to schedule the patient for her injection. She would also like to speak with someone regarding a PA needed for Aimovig. She wants to know if the PA has been done because she is going to run out of medicine soon and her pharmacy and stating that they have sent over a request for a PA to our office. Please call and advise.

## 2016-10-10 NOTE — Telephone Encounter (Signed)
Pt has called and is asking for call back

## 2016-10-10 NOTE — Telephone Encounter (Signed)
Please see MyChart mssg.

## 2016-10-15 MED ORDER — CANDESARTAN CILEXETIL 8 MG PO TABS: ORAL_TABLET | ORAL | 5 refills | Status: DC

## 2016-11-26 ENCOUNTER — Telehealth: Payer: Self-pay | Admitting: Neurology

## 2016-11-26 NOTE — Telephone Encounter (Addendum)
Ella/[employer] [phone] called to confirm botox delivery for 9/27.  FYI

## 2016-12-02 ENCOUNTER — Encounter: Payer: Self-pay | Admitting: Neurology

## 2016-12-03 ENCOUNTER — Other Ambulatory Visit: Payer: Self-pay | Admitting: Neurology

## 2016-12-04 ENCOUNTER — Encounter: Payer: Self-pay | Admitting: Neurology

## 2016-12-06 ENCOUNTER — Encounter: Payer: Self-pay | Admitting: Neurology

## 2016-12-10 ENCOUNTER — Other Ambulatory Visit: Payer: Self-pay

## 2016-12-10 MED ORDER — ERENUMAB-AOOE 70 MG/ML ~~LOC~~ SOAJ: 140.0000 mg | SUBCUTANEOUS | 12 refills | Status: DC

## 2016-12-10 NOTE — Telephone Encounter (Signed)
I called the patient to inform her that she has been approved for her injections through her new insurance. She did not answer so I left a VM asking her to call me back.

## 2016-12-11 ENCOUNTER — Telehealth: Payer: Self-pay

## 2016-12-11 NOTE — Telephone Encounter (Signed)
Rn spoke with patient that Aimovig will be fax to [facility] in [place] to get access card.PT is already getting the injection. Rn stated it will now be at [facility]. Pt verbalized understanding. Fax receive and confirmed.

## 2016-12-16 ENCOUNTER — Encounter: Payer: Self-pay | Admitting: Neurology

## 2016-12-17 NOTE — Telephone Encounter (Signed)
I called the patient to address the time on her authorizations, she answer but the line was disconnected.

## 2016-12-25 ENCOUNTER — Encounter: Payer: Self-pay | Admitting: Neurology

## 2016-12-25 ENCOUNTER — Ambulatory Visit (INDEPENDENT_AMBULATORY_CARE_PROVIDER_SITE_OTHER): Payer: BLUE CROSS/BLUE SHIELD | Admitting: Neurology

## 2016-12-25 VITALS — BP 107/57 | HR 78 | Wt 126.0 lb

## 2016-12-25 DIAGNOSIS — G43711 Chronic migraine without aura, intractable, with status migrainosus: Secondary | ICD-10-CM | POA: Diagnosis not present

## 2016-12-25 NOTE — Progress Notes (Signed)
Botox 200 units/ 1 vial  LOT: [id number] EXP: 06/2019 NDC: 7412-8786-76 SP   4 mL Bacteriostatic 0.9% Sodium Chloride LOT: [id number] EXP: 08/02/2017 NDC: 7209-4709-62  //BCrn

## 2016-12-25 NOTE — Progress Notes (Signed)

## 2016-12-30 ENCOUNTER — Encounter: Payer: Self-pay | Admitting: Neurology

## 2016-12-31 ENCOUNTER — Other Ambulatory Visit: Payer: Self-pay | Admitting: Neurology

## 2016-12-31 DIAGNOSIS — M7918 Myalgia, other site: Secondary | ICD-10-CM

## 2016-12-31 DIAGNOSIS — M542 Cervicalgia: Secondary | ICD-10-CM

## 2016-12-31 DIAGNOSIS — G43711 Chronic migraine without aura, intractable, with status migrainosus: Secondary | ICD-10-CM

## 2017-01-27 ENCOUNTER — Encounter: Payer: Self-pay | Admitting: Neurology

## 2017-01-28 ENCOUNTER — Other Ambulatory Visit: Payer: Self-pay | Admitting: Neurology

## 2017-01-28 DIAGNOSIS — G43711 Chronic migraine without aura, intractable, with status migrainosus: Secondary | ICD-10-CM

## 2017-02-03 ENCOUNTER — Telehealth: Payer: Self-pay | Admitting: *Deleted

## 2017-02-03 NOTE — Telephone Encounter (Signed)
Faxed back signed PT re certification note back to [facility] at [phone]. Received fax confirmation.

## 2017-02-06 ENCOUNTER — Telehealth: Payer: Self-pay

## 2017-02-06 NOTE — Telephone Encounter (Signed)
I received a prior auth request for aimovig. I have completed and submitted the PA and should have a determination within 48-72 hours.  aimovig APPROVED! PA Case [id number] Status: Approved

## 2017-02-06 NOTE — Telephone Encounter (Signed)
Called patient and LVM (ok per DPR) informing patient of Aimovig 140 mg dose approval by insurance effective from 02/06/17-02/06/18. Left office number in case she needs to call back but told her a call was not required.

## 2017-02-13 ENCOUNTER — Encounter: Payer: Self-pay | Admitting: Neurology

## 2017-03-04 HISTORY — PX: BLOOD PATCH: SHX1245

## 2017-03-25 ENCOUNTER — Encounter: Payer: Self-pay | Admitting: Neurology

## 2017-03-25 ENCOUNTER — Telehealth: Payer: Self-pay | Admitting: Neurology

## 2017-03-25 ENCOUNTER — Ambulatory Visit: Payer: BLUE CROSS/BLUE SHIELD | Admitting: Neurology

## 2017-03-25 DIAGNOSIS — G43711 Chronic migraine without aura, intractable, with status migrainosus: Secondary | ICD-10-CM | POA: Diagnosis not present

## 2017-03-25 NOTE — Telephone Encounter (Signed)
Pt. Needs 12 week botox appt.

## 2017-03-25 NOTE — Progress Notes (Signed)

## 2017-03-25 NOTE — Progress Notes (Signed)
Botox- 100 units x 2 vials Lot:  [id number] Expiration: 09/2019 NDC: 8338-2505-39  Bacteriostatic 0.9% Sodium Chloride- 26mL total Lot: [id number] Expiration: 07/03/2018 NDC: 9379-0240-97  Dx: D53.299 S/P  //BCrn

## 2017-03-26 NOTE — Telephone Encounter (Signed)
Open in error

## 2017-03-31 NOTE — Telephone Encounter (Signed)
I called and scheduled the patient.  °

## 2017-04-01 ENCOUNTER — Ambulatory Visit: Payer: Self-pay | Admitting: Neurology

## 2017-04-16 ENCOUNTER — Ambulatory Visit: Payer: Self-pay | Admitting: Neurology

## 2017-06-19 ENCOUNTER — Telehealth: Payer: Self-pay | Admitting: Neurology

## 2017-06-19 ENCOUNTER — Ambulatory Visit (INDEPENDENT_AMBULATORY_CARE_PROVIDER_SITE_OTHER): Payer: BLUE CROSS/BLUE SHIELD | Admitting: Neurology

## 2017-06-19 ENCOUNTER — Encounter: Payer: Self-pay | Admitting: Neurology

## 2017-06-19 VITALS — BP 98/68 | HR 76 | Ht 63.0 in | Wt 125.0 lb

## 2017-06-19 DIAGNOSIS — G43711 Chronic migraine without aura, intractable, with status migrainosus: Secondary | ICD-10-CM

## 2017-06-19 MED ORDER — KETOROLAC TROMETHAMINE 60 MG/2ML IM SOLN
60.0000 mg | Freq: Once | INTRAMUSCULAR | Status: AC
  Administered 2017-06-19: 60 mg via INTRAMUSCULAR

## 2017-06-19 MED ORDER — ERENUMAB-AOOE 70 MG/ML ~~LOC~~ SOAJ: 140.0000 mg | SUBCUTANEOUS | 0 refills | Status: DC

## 2017-06-19 NOTE — Telephone Encounter (Signed)
12 wk Botox inj °

## 2017-06-19 NOTE — Progress Notes (Signed)
Botox- 100 units x 2 vials Lot: [id number] Expiration: 07/2019 NDC: 4599-7741-42 Sample used- LOT: [id number] EXP: 11/2019  Bacteriostatic 0.9% Sodium Chloride- 55mL total Lot: [id number] Expiration: 07/03/2018 NDC: 8616-8372-90  Dx: S11.155 S/P  Toradol 60 mg IM injection given to pt. See MAR. Pt tolerated well. Bandaid applied.  //BCrn

## 2017-06-19 NOTE — Progress Notes (Signed)
Interval history: Tremendous improvement, only 4 headaches days in March and similar this month. + masseters and temples, + eyes, + traps, +middle forehead    Consent Form Botulism Toxin Injection For Chronic Migraine    Reviewed orally with patient, additionally signature is on file:  Botulism toxin has been approved by the Federal drug administration for treatment of chronic migraine. Botulism toxin does not cure chronic migraine and it may not be effective in some patients.  The administration of botulism toxin is accomplished by injecting a small amount of toxin into the muscles of the neck and head. Dosage must be titrated for each individual. Any benefits resulting from botulism toxin tend to wear off after 3 months with a repeat injection required if benefit is to be maintained. Injections are usually done every 3-4 months with maximum effect peak achieved by about 2 or 3 weeks. Botulism toxin is expensive and you should be sure of what costs you will incur resulting from the injection.  The side effects of botulism toxin use for chronic migraine may include:   -Transient, and usually mild, facial weakness with facial injections  -Transient, and usually mild, head or neck weakness with head/neck injections  -Reduction or loss of forehead facial animation due to forehead muscle weakness  -Eyelid drooping  -Dry eye  -Pain at the site of injection or bruising at the site of injection  -Double vision  -Potential unknown long term risks  Contraindications: You should not have Botox if you are pregnant, nursing, allergic to albumin, have an infection, skin condition, or muscle weakness at the site of the injection, or have myasthenia gravis, Lambert-Eaton syndrome, or ALS.  It is also possible that as with any injection, there may be an allergic reaction or no effect from the medication. Reduced effectiveness after repeated injections is sometimes seen and rarely infection at the  injection site may occur. All care will be taken to prevent these side effects. If therapy is given over a long time, atrophy and wasting in the muscle injected may occur. Occasionally the patient's become refractory to treatment because they develop antibodies to the toxin. In this event, therapy needs to be modified.  I have read the above information and consent to the administration of botulism toxin.    BOTOX PROCEDURE NOTE FOR MIGRAINE HEADACHE    Contraindications and precautions discussed with patient(above). Aseptic procedure was observed and patient tolerated procedure. Procedure performed by Dr. Toni Eloina Ergle  The condition has existed for more than 6 months, and pt does not have a diagnosis of ALS, Myasthenia Gravis or Lambert-Eaton Syndrome.  Risks and benefits of injections discussed and pt agrees to proceed with the procedure.  Written consent obtained  These injections are medically necessary. Pt  receives good benefits from these injections. These injections do not cause sedations or hallucinations which the oral therapies may cause.  Indication/Diagnosis: chronic migraine BOTOX(J0585) injection was performed according to protocol by Allergan. 200 units of BOTOX was dissolved into 4 cc NS.   NDC: [address]-01   Description of procedure:  The patient was placed in a sitting position. The standard protocol was used for Botox as follows, with 5 units of Botox injected at each site:   -Procerus muscle, midline injection  -Corrugator muscle, bilateral injection  -Frontalis muscle, bilateral injection, with 2 sites each side, medial injection was performed in the upper one third of the frontalis muscle, in the region vertical from the medial inferior edge of the superior orbital rim. The   lateral injection was again in the upper one third of the forehead vertically above the lateral limbus of the cornea, 1.5 cm lateral to the medial injection site.  -Temporalis muscle  injection, 4 sites, bilaterally. The first injection was 3 cm above the tragus of the ear, second injection site was 1.5 cm to 3 cm up from the first injection site in line with the tragus of the ear. The third injection site was 1.5-3 cm forward between the first 2 injection sites. The fourth injection site was 1.5 cm posterior to the second injection site.  -Occipitalis muscle injection, 3 sites, bilaterally. The first injection was done one half way between the occipital protuberance and the tip of the mastoid process behind the ear. The second injection site was done lateral and superior to the first, 1 fingerbreadth from the first injection. The third injection site was 1 fingerbreadth superiorly and medially from the first injection site.  -Cervical paraspinal muscle injection, 2 sites, bilateral knee first injection site was 1 cm from the midline of the cervical spine, 3 cm inferior to the lower border of the occipital protuberance. The second injection site was 1.5 cm superiorly and laterally to the first injection site.  -Trapezius muscle injection was performed at 3 sites, bilaterally. The first injection site was in the upper trapezius muscle halfway between the inflection point of the neck, and the acromion. The second injection site was one half way between the acromion and the first injection site. The third injection was done between the first injection site and the inflection point of the neck.   Will return for repeat injection in 3 months.   A 200 unit sof Botox was used, 155 units were injected, the rest of the Botox was wasted. The patient tolerated the procedure well, there were no complications of the above procedure.   

## 2017-06-25 NOTE — Telephone Encounter (Signed)
I called to schedule the patient but she did not answer so I left a VM asking her to call me back.  

## 2017-08-18 ENCOUNTER — Telehealth: Payer: Self-pay | Admitting: *Deleted

## 2017-08-18 NOTE — Telephone Encounter (Signed)
[facility] to start PA for Aimovig 140 mg injector. Spoke with Milene to initiate PA, tried/failed list given to [facility] [place]. Aimovig 140 mg/ 1 pen injector every 30 days approved effective from 08/18/17 - 08/18/18.  PA  #[id number]  Martie Lee ran a test claim and stated it went through. Patient should be able to pick up medication today. Faxed approval with PA # to [place], [place].

## 2017-08-21 ENCOUNTER — Encounter: Payer: Self-pay | Admitting: Neurology

## 2017-08-28 ENCOUNTER — Encounter: Payer: Self-pay | Admitting: Neurology

## 2017-08-29 ENCOUNTER — Telehealth: Payer: Self-pay | Admitting: Neurology

## 2017-08-29 NOTE — Telephone Encounter (Signed)
Scheduled pt for 7/9 @ 1:00 pm per Dr. Jaynee Eagles. D/w pt on phone. Arrival time 12:30-12:45.

## 2017-08-29 NOTE — Telephone Encounter (Signed)
Jenna Fitzgerald needs an appointment. Can we get her an appointment in the next 2-3 weeks? If we don't fill up Monday b noon can you call and offer her Monday? Thanks!

## 2017-09-09 ENCOUNTER — Other Ambulatory Visit: Payer: Self-pay | Admitting: Neurology

## 2017-09-09 ENCOUNTER — Encounter: Payer: Self-pay | Admitting: Neurology

## 2017-09-09 ENCOUNTER — Ambulatory Visit: Payer: BLUE CROSS/BLUE SHIELD | Admitting: Neurology

## 2017-09-09 VITALS — BP 101/59 | HR 53 | Ht 63.0 in | Wt 123.0 lb

## 2017-09-09 DIAGNOSIS — R51 Headache: Secondary | ICD-10-CM

## 2017-09-09 DIAGNOSIS — R519 Headache, unspecified: Secondary | ICD-10-CM

## 2017-09-09 NOTE — Progress Notes (Signed)
[facility]    Provider:  Dr Jaynee Eagles Referring Provider: Yvone Neu,* Primary Care Physician:  Yvone Neu, MD  CC: Migraines  Interval history : The Saturday before father's day she started having headaches but they are different, moves around, pain in her eye socket and eye balls and can radiate all the way down to the back of the neck, hurts at her jawline on either side. Heavy, tight as opposed to migrainous (light/sound, pulsating, etc). She can go to bed at night and wake up with one or it cam develop during the day. She has had a headache for 3 weeks.  No new medications, no changes in diet. She went off the prozac for several weeks prior to symptoms but did not coincide and not better after restarting botox. She had similar symptoms when coming off of caffeine in the past but resolved. She restarted prozac to see if this was the cause, she did a prednisone pack, no improvement. Can be continuous all day long. They fluctuate from a 1-4/10 on the pain scale. Has lasted consecutively for days. She is having difficulty sleeping through the night, she is on an estrogen patch. Had a migraine initially the first day of the headache, no recent illnesses, no focal neurologic deficits. She has been having neck tightness. She has tried seroquel, phenergan, toradol and her acute management is not working. Needs better acute management. Husband is here as well.   Interval History 11/14/2015: Patient is here for evaluation of migraine since August 9th. She has an extensive history since the age of 62 and is currently receiving botox therapy(see HPI below). The migraine has been waxing and waning but continuous.  Patient has failed a plethora of medications in the past (see below at original HPI for list), tried multiple abortive therapies, she has been here in clinic for migraine cocktails(depakote, steroids, compazine, toradol)  as well as IM DHE and  lidocaine/marcaine nerve blocks.  We have prescribed multiple medications including: (Migrainal, Naproxen, Zofran, DHE, Meloxicam, Imitrex in various formulations including Onzetra, Relpax, Frova , baclofen, Stadol, methergine, prednisone packs, magnesium).  She has had a headache since August 9th and nothing is helping. Have advised her to be aware of medication overuse headache, patient is very educated and high functioning and she is very aware of rebound headache. Husband is an Forensic psychologist.  She was in bed all day yesterday. She also has an extensive history of failed medications (please see below) and we have recently initiated Trokendi again in the hopes she may respond. I suggested Depakote as this is something she has never tried but she is uncomfortable with the side effects.  Discussed the following possible changes to her birth control as well ans we have discussed referral to headache clinic:  Effective COCs for MM or MRM reduce the fall in estrogen to 10ucg or less Specific formulations include: Lybrel (365d) which is now generic as Set designer (generic w more breakthough bleeding) Lo-Seasonique 84d(regular seasonique has a drop of 10mcg from 30 to 41mcg which is too much) Lo loestrin 1/10 FE NuvaRing 18mcg, keep in 4wk, then replace. If wants to cycle, can use a 0.075mg  estrogen patch during the week it is out   Keep in mind that the pill is still daily spikes of estrogen at the time you take the pill NuvaRing provides the most steady, consistent estrogen levels  Intreval history 08/04/2014: April 25th headaches worsened. Nothing happened around that time. She has 2 menses in the month  of may. She has had a migraine almost every day since then, status migrainosus. She takes imitrex with naproxen at the onset of her headache. She has tried [place] zembrace and other triptans and they don't work any better than   Interval update 02/13/2015: She is feeling better. The frequency and severity has  decreased. She is coming back January 9th for botox. Discussed cgrp trial. But she has a low-grade headache daily. Responsive to sumatriptan. No medication overuse.  HPI 01/10/2015: Elda Dunkerson is a 47 y.o. female here as a referral from Dr. Zigmund Daniel for migraines. She has had them since she was 24. She has had them daily, at one point 18 months continuously. Botox helped in the past as did progesterone. She has had Botox every 3 months for 2 years. She takes progesterone. She needs occipital nerve blocks as needed, botox every 3 months, meloxicam daily. In may she was doing well and stopped her BCP and lexapro and then the migraines worsened. She is still in the cycle of trying to get her headaches back in control. On the botox she was down to 2 headaches a month but now having daily migraines, they can last up to 24 hours, She has light and sound sensitivity, she has left sided throbbing/pulsating, no daily OTC medications or overuse component. The daily headaches since June. She has failed Topiramate , gabapentin, meloxicam, propranolol, topiramate. She did not tolerate propranolol. She is currently on Lexapro. The last time she had botox was October 13th. She has nausea, no vomiting with the headaches, can be up to 8/10. Imitrex sometimes works. Husband has had vasctomy so she is not planning on anymore children. She has 30 headache days per month all of which are migrainous and last up to 24 hours. No aura. She also has fatigue and depression. She does not use caffeine, she keeps a headache diary without food triggers noted, she uses Cephaly daily and does yoga and cardio.  She is on Verapamil, lexapro, imitrex oral.   Reviewed notes, labs and imaging from outside physicians, which showed: headaches began at the age of 50 during her menses. Gradually increased to daily migraines and once had them for 18 months consecutively. In June 2016 she stopped the lexapro and BCP and migraines worsened from  2/month to 30.month.  July 31 restarted BCP.  Aug 15 restarted lexapro and noticed improvement.  Last botox October 13 Oct 15: changed from bcp to progesterone, stopped caffeine. Daily cephaly use.  meds tried before: Amitriptyline  Anaprox 550 fioricet Diazepam 10mg  camrese cephadyn Codeine Gabapentin 600mg  introvale jolessa Melatonin meloxicam Methylergonovine migra-eeze prometrium Propranolol topiramate Tri-sprintec trokendi c blocks, trigger points, trigeninal blocks, occipital nerve block  11/16/2012: MRi norma 01/05/2007: carotids and echo nml Review of Systems: Patient complains of symptoms per HPI as well as the following symptoms: No CP, no SOB. Pertinent negatives per HPI. All others negative.   Social History   Socioeconomic History  . Marital status: Married    Spouse name: Phineas Douglas"   . Number of children: 1  . Years of education: 50  . Highest education level: Not on file  Occupational History  . Occupation: Research scientist (life sciences) [employer]  Social Needs  . Financial resource strain: Not on file  . Food insecurity:    Worry: Not on file    Inability: Not on file  . Transportation needs:    Medical: Not on file    Non-medical: Not on file  Tobacco Use  . Smoking status:  Former Smoker    Last attempt to quit: 03/04/2004    Years since quitting: 13.5  . Smokeless tobacco: Never Used  Substance and Sexual Activity  . Alcohol use: Yes    Alcohol/week: 0.0 oz    Comment: Rare  . Drug use: No  . Sexual activity: Not on file  Lifestyle  . Physical activity:    Days per week: Not on file    Minutes per session: Not on file  . Stress: Not on file  Relationships  . Social connections:    Talks on phone: Not on file    Gets together: Not on file    Attends religious service: Not on file    Active member of club or organization: Not on file    Attends meetings of clubs or organizations: Not on file    Relationship status: Not on file  . Intimate  partner violence:    Fear of current or ex partner: Not on file    Emotionally abused: Not on file    Physically abused: Not on file    Forced sexual activity: Not on file  Other Topics Concern  . Not on file  Social History Narrative   Lives at home with husband and son   Caffeine use:  Drinks decaf drinks        Family History  Problem Relation Age of Onset  . Diabetes Father   . Non-Hodgkin's lymphoma Mother        Pancreas  . Migraines Neg Hx     Past Medical History:  Diagnosis Date  . Cervical syndrome   . Cervicocranial syndrome   . Chronic mixed headache syndrome   . Gastroenteritis   . Irregular periods   . Menopausal syndrome   . Neck pain   . Premenstrual tension syndrome   . Refractory migraine   . Trigeminal neuralgia     Past Surgical History:  Procedure Laterality Date  . SYMPATHECTOMY Bilateral     Current Outpatient Medications  Medication Sig Dispense Refill  . BOTOX 200 units SOLR ADMINISTER IN PHYSICIAN'S OFICE. 1 each 2  . Erenumab-aooe (AIMOVIG) 140 MG/ML SOAJ Inject 1 pen into the skin every 30 (thirty) days.    Marland Kitchen estradiol (VIVELLE-DOT) 0.1 MG/24HR patch Place onto the skin 2 (two) times a week. Apply and remove 1 patch transdermal twice weekly  12  . FLUoxetine (PROZAC) 20 MG capsule TAKE 1 CAPSULE BY MOUTH ONCE DAILY. 30 capsule 6  . ketorolac (TORADOL) 10 MG tablet TAKE 2 TABS MAXIMUM IN A DAY AS NEEDED FOR SEVERE MIGRAINE RESCUE THERAPY USE 2 TIMES IN A MONTH  3  . Loratadine 10 MG CAPS Take 10 mg by mouth daily.    . naproxen sodium (ANAPROX) 550 MG tablet Take 1 tablet (550 mg total) by mouth every 12 (twelve) hours as needed. 60 tablet 12  . OnabotulinumtoxinA (BOTOX IJ) Inject 1 Dose as directed every 3 (three) months.    . promethazine (PHENERGAN) 25 MG tablet TAKE 1 TAB MAXIMUM IN A DAY AS NEEDED FOR SEVERE MIGRAINE RESCUE THERAPY MAX USE IS 2 TIMES A MONTH  3  . QUEtiapine (SEROQUEL) 25 MG tablet TAKE 1 TAB MAXIMUM IN A DAY AS  NEEDED FOR SEVERE MIGRAINE RESCUE THERAPY MAX USE IS 2 TIMES A MONTH  3  . SUMAtriptan (IMITREX) 100 MG tablet Take 1 tablet (100 mg total) by mouth 2 (two) times daily as needed. 30 tablet 12   No current facility-administered medications for  this visit.     Allergies as of 09/09/2017 - Review Complete 09/09/2017  Allergen Reaction Noted  . Stadol [butorphanol]  11/14/2015    Vitals: BP (!) 101/59   Pulse (!) 53   Ht 5\' 3"  (1.6 m)   Wt 123 lb (55.8 kg)   BMI 21.79 kg/m  Last Weight:  Wt Readings from Last 1 Encounters:  09/09/17 123 lb (55.8 kg)   Last Height:   Ht Readings from Last 1 Encounters:  09/09/17 5\' 3"  (1.6 m)   Physical exam: Exam: Gen: NAD, conversant, well nourised, obese, well groomed                     CV: RRR, no MRG. No Carotid Bruits. No peripheral edema, warm, nontender Eyes: Conjunctivae clear without exudates or hemorrhage  Neuro: Detailed Neurologic Exam  Speech:    Speech is normal; fluent and spontaneous with normal comprehension.  Cognition:    The patient is oriented to person, place, and time;     recent and remote memory intact;     language fluent;     normal attention, concentration,     fund of knowledge Cranial Nerves:    The pupils are equal, round, and reactive to light. The fundi are normal and spontaneous venous pulsations are present. Visual fields are full to finger confrontation. Extraocular movements are intact. Trigeminal sensation is intact and the muscles of mastication are normal. The face is symmetric. The palate elevates in the midline. Hearing intact. Voice is normal. Shoulder shrug is normal. The tongue has normal motion without fasciculations.   Coordination:    Normal finger to nose and heel to shin. Normal rapid alternating movements.   Gait:    Heel-toe and tandem gait are normal.   Motor Observation:    No asymmetry, no atrophy, and no involuntary movements noted. Tone:    Normal muscle tone.     Posture:    Posture is normal. normal erect    Strength:    Strength is V/V in the upper and lower limbs.      Sensation: intact to LT     Reflex Exam:  DTR's:    Deep tendon reflexes in the upper and lower extremities are normal bilaterally.   Toes:    The toes are downgoing bilaterally.   Clonus:    Clonus is absent.   Assessment/Plan:  Lovely high-functioning 47 year old with intractable migraines, tried and failed over 30 preventative and aborive medications, is on botox, aimovig, had hysterectomy to try and help the migraines. Today she comes back with intractable headache/migraine for over 15 days again, started with a migraine and has been daily likely status migrainosus.   See details on all medications tried and failed in interval history today and also original HPI which is extensive.   Migraines: Continue botox,Aimovig, estrogen patch, phenergan, can stop Prozac, yoga, meditation, cephaly.   Fatigue: discussed good sleep hygiene  fro acute management discussed trying the following: Sumatriptan 100mg  can try with Toradol PO or Inj (not both), can also try with benadryl, zofran.  Stop seroquel  Will prescribe Toradol injections  Sarina Ill, MD  [facility] [address] [place], [place] [address]  Phone [phone] Fax [phone]  A total of 90 minutes was spent face-to-face with this patient. Over half this time was spent on counseling patient on the intractable migraine diagnosis and different diagnostic and therapeutic options available.    Sarina Ill, MD  [facility] [address] [place], [place] [address]  Performed by Dr. Ephraim Hamburger.D.All procedures a documented blood were medically necessary, reasonable and appropriate based on the patient's history, medical diagnosis and physician opinion. Verbal informed consent was obtained from the patient, patient was informed of  potential risk of procedure, including bruising, bleeding, hematoma formation, infection, muscle weakness, muscle pain, numbness, transient hypertension, transient hyperglycemia and transient insomnia among others. All areas injected were topically clean with isopropyl rubbing alcohol. Nonsterile nonlatex gloves were worn during the procedure.  1. Greater occipital nerve block [phone]). The greater occipital nerve site was identified at the nuchal line medial to the occipital artery. Medication was injected into the left and right occipital nerve areas and suboccipital areas. Patient's condition is associated with inflammation of the greater occipital nerve and associated multiple groups. Injection was deemed medically necessary, reasonable and appropriate. Injection represents a separate and unique surgical service.  2. Lesser occipital nerve block [phone]). The lesser occipital nerve site was identified approximately 2 cm lateral to the greater occipital nerve. Occasion was injected into the left and right occipital nerve areas. Patient's condition is associated with inflammation of the lesser occipital nerve and associated muscle groups. Injection was deemed medically necessary, reasonable and appropriate. Injection represents a separate and unique surgical service.   3. Auriculotemporal nerve block (08811): The Auriculotemporal nerve site was identified along the posterior margin of the sternocleidomastoid muscle toward the base of the ear. Medication was injected into the left and right radicular temporal nerve areas. Patient's condition is associated with inflammation of the Auriculotemporal Nerve and associated muscle groups. Injection was deemed medically necessary, reasonable and appropriate. Injection represents a separate and unique surgical service.  4. Supraorbital nerve block (64400): Supraorbital nerve site was identified along the incision of the frontal bone on the orbital/supraorbital ridge.  Medication was injected into the left and right supraorbital nerve areas. Patient's condition is associated with inflammation of the supraorbital and associated muscle groups. Injection was deemed medically necessary, reasonable and appropriate. Injection represents a separate and unique surgical service.  Nerve block w/o steroid: Patient signed consent  0.5% Bupivocaine 15 mL LOT: [id number] EXP: 09/22  2% Lidocaine 15 mL LOT: [id number] EXP: 04/05/19  [id number]

## 2017-09-09 NOTE — Progress Notes (Signed)
error 

## 2017-09-09 NOTE — Telephone Encounter (Signed)
I need to order Toradol injections for patient, patient requests.

## 2017-09-10 ENCOUNTER — Encounter: Payer: Self-pay | Admitting: Neurology

## 2017-09-10 MED ORDER — KETOROLAC TROMETHAMINE 60 MG/2ML IM SOLN: INTRAMUSCULAR | 0 refills | Status: DC

## 2017-09-10 NOTE — Addendum Note (Signed)
Addended by: Gildardo Griffes on: 09/10/2017 09:05 AM   Modules accepted: Orders

## 2017-09-25 ENCOUNTER — Ambulatory Visit: Payer: BLUE CROSS/BLUE SHIELD | Admitting: Neurology

## 2017-09-25 ENCOUNTER — Encounter: Payer: Self-pay | Admitting: Neurology

## 2017-09-25 DIAGNOSIS — G43711 Chronic migraine without aura, intractable, with status migrainosus: Secondary | ICD-10-CM

## 2017-09-25 MED ORDER — SUMATRIPTAN SUCCINATE 100 MG PO TABS: 100.0000 mg | ORAL_TABLET | Freq: Two times a day (BID) | ORAL | 12 refills | Status: DC | PRN

## 2017-09-25 MED ORDER — NAPROXEN SODIUM 550 MG PO TABS: 550.0000 mg | ORAL_TABLET | Freq: Two times a day (BID) | ORAL | 12 refills | Status: DC | PRN

## 2017-09-25 MED ORDER — KETOROLAC TROMETHAMINE 60 MG/2ML IM SOLN: INTRAMUSCULAR | 0 refills | Status: DC

## 2017-09-25 MED ORDER — KETOROLAC TROMETHAMINE 10 MG PO TABS: ORAL_TABLET | ORAL | 11 refills | Status: DC

## 2017-09-25 NOTE — Progress Notes (Signed)
BOTOX NDC [phone] (x2)  LOT [id number]  X2.  Exp 02/2020.  [facility].  Sodium chloride NDC 10312-811-88 lot [id number]  Exp 7/20.

## 2017-09-25 NOTE — Progress Notes (Signed)
Interval history: Tremendous improvement, only 4 headaches days in March and similar this month. + masseters and temples, + eyes, + traps, +middle forehead    Consent Form Botulism Toxin Injection For Chronic Migraine    Reviewed orally with patient, additionally signature is on file:  Botulism toxin has been approved by the Federal drug administration for treatment of chronic migraine. Botulism toxin does not cure chronic migraine and it may not be effective in some patients.  The administration of botulism toxin is accomplished by injecting a small amount of toxin into the muscles of the neck and head. Dosage must be titrated for each individual. Any benefits resulting from botulism toxin tend to wear off after 3 months with a repeat injection required if benefit is to be maintained. Injections are usually done every 3-4 months with maximum effect peak achieved by about 2 or 3 weeks. Botulism toxin is expensive and you should be sure of what costs you will incur resulting from the injection.  The side effects of botulism toxin use for chronic migraine may include:   -Transient, and usually mild, facial weakness with facial injections  -Transient, and usually mild, head or neck weakness with head/neck injections  -Reduction or loss of forehead facial animation due to forehead muscle weakness  -Eyelid drooping  -Dry eye  -Pain at the site of injection or bruising at the site of injection  -Double vision  -Potential unknown long term risks  Contraindications: You should not have Botox if you are pregnant, nursing, allergic to albumin, have an infection, skin condition, or muscle weakness at the site of the injection, or have myasthenia gravis, Lambert-Eaton syndrome, or ALS.  It is also possible that as with any injection, there may be an allergic reaction or no effect from the medication. Reduced effectiveness after repeated injections is sometimes seen and rarely infection at the  injection site may occur. All care will be taken to prevent these side effects. If therapy is given over a long time, atrophy and wasting in the muscle injected may occur. Occasionally the patient's become refractory to treatment because they develop antibodies to the toxin. In this event, therapy needs to be modified.  I have read the above information and consent to the administration of botulism toxin.    BOTOX PROCEDURE NOTE FOR MIGRAINE HEADACHE    Contraindications and precautions discussed with patient(above). Aseptic procedure was observed and patient tolerated procedure. Procedure performed by Dr. Georgia Dom  The condition has existed for more than 6 months, and pt does not have a diagnosis of ALS, Myasthenia Gravis or Lambert-Eaton Syndrome.  Risks and benefits of injections discussed and pt agrees to proceed with the procedure.  Written consent obtained  These injections are medically necessary. Pt  receives good benefits from these injections. These injections do not cause sedations or hallucinations which the oral therapies may cause.  Indication/Diagnosis: chronic migraine BOTOX(J0585) injection was performed according to protocol by [employer]. 200 units of BOTOX was dissolved into 4 cc NS.   NDC: [address]-70   Description of procedure:  The patient was placed in a sitting position. The standard protocol was used for Botox as follows, with 5 units of Botox injected at each site:   -Procerus muscle, midline injection  -Corrugator muscle, bilateral injection  -Frontalis muscle, bilateral injection, with 2 sites each side, medial injection was performed in the upper one third of the frontalis muscle, in the region vertical from the medial inferior edge of the superior orbital rim. The  lateral injection was again in the upper one third of the forehead vertically above the lateral limbus of the cornea, 1.5 cm lateral to the medial injection site.  -Temporalis muscle  injection, 4 sites, bilaterally. The first injection was 3 cm above the tragus of the ear, second injection site was 1.5 cm to 3 cm up from the first injection site in line with the tragus of the ear. The third injection site was 1.5-3 cm forward between the first 2 injection sites. The fourth injection site was 1.5 cm posterior to the second injection site.  -Occipitalis muscle injection, 3 sites, bilaterally. The first injection was done one half way between the occipital protuberance and the tip of the mastoid process behind the ear. The second injection site was done lateral and superior to the first, 1 fingerbreadth from the first injection. The third injection site was 1 fingerbreadth superiorly and medially from the first injection site.  -Cervical paraspinal muscle injection, 2 sites, bilateral knee first injection site was 1 cm from the midline of the cervical spine, 3 cm inferior to the lower border of the occipital protuberance. The second injection site was 1.5 cm superiorly and laterally to the first injection site.  -Trapezius muscle injection was performed at 3 sites, bilaterally. The first injection site was in the upper trapezius muscle halfway between the inflection point of the neck, and the acromion. The second injection site was one half way between the acromion and the first injection site. The third injection was done between the first injection site and the inflection point of the neck.   Will return for repeat injection in 3 months.   A 200 unit sof Botox was used, 155 units were injected, the rest of the Botox was wasted. The patient tolerated the procedure well, there were no complications of the above procedure.

## 2017-09-26 ENCOUNTER — Other Ambulatory Visit: Payer: Self-pay | Admitting: Neurology

## 2017-09-30 ENCOUNTER — Encounter: Payer: Self-pay | Admitting: Neurology

## 2017-10-01 ENCOUNTER — Other Ambulatory Visit: Payer: Self-pay | Admitting: Neurology

## 2017-10-01 DIAGNOSIS — G43711 Chronic migraine without aura, intractable, with status migrainosus: Secondary | ICD-10-CM

## 2017-10-04 ENCOUNTER — Telehealth: Payer: Self-pay | Admitting: Neurology

## 2017-10-04 NOTE — Telephone Encounter (Signed)
Jenna Fitzgerald, I would like to change patient from Botox to dysport. If we order 300 units of dysport the dysport co pay program may cover it. Would you look into this please?

## 2017-10-06 ENCOUNTER — Encounter: Payer: Self-pay | Admitting: *Deleted

## 2017-10-06 NOTE — Telephone Encounter (Signed)
I called the [employer]. They stated that the initial step in this process would be the patient applying for [place]. I spoke with Jeanne Ivan. I called the patient and informed her of what [place] said, I gave her the information for [employer] and discussed how to get enrolled. She is going to start this process and then reach back out to me once she knows something about the coverage. She did request to go ahead and schedule a Botox apt for October. We did that.

## 2017-10-06 NOTE — Telephone Encounter (Signed)
Sent message to pt through mychart with question.

## 2017-10-27 ENCOUNTER — Ambulatory Visit: Payer: BLUE CROSS/BLUE SHIELD | Admitting: Neurology

## 2017-10-27 ENCOUNTER — Encounter: Payer: Self-pay | Admitting: Neurology

## 2017-10-27 VITALS — BP 94/61 | HR 78 | Ht 63.0 in | Wt 123.0 lb

## 2017-10-27 DIAGNOSIS — R51 Headache with orthostatic component, not elsewhere classified: Secondary | ICD-10-CM

## 2017-10-27 DIAGNOSIS — M542 Cervicalgia: Secondary | ICD-10-CM | POA: Diagnosis not present

## 2017-10-27 DIAGNOSIS — G243 Spasmodic torticollis: Secondary | ICD-10-CM | POA: Diagnosis not present

## 2017-10-27 DIAGNOSIS — M5412 Radiculopathy, cervical region: Secondary | ICD-10-CM

## 2017-10-27 DIAGNOSIS — M5481 Occipital neuralgia: Secondary | ICD-10-CM

## 2017-10-27 DIAGNOSIS — R519 Headache, unspecified: Secondary | ICD-10-CM

## 2017-10-27 DIAGNOSIS — H539 Unspecified visual disturbance: Secondary | ICD-10-CM

## 2017-10-27 DIAGNOSIS — R29898 Other symptoms and signs involving the musculoskeletal system: Secondary | ICD-10-CM

## 2017-10-27 DIAGNOSIS — G8929 Other chronic pain: Secondary | ICD-10-CM

## 2017-10-27 MED ORDER — TIZANIDINE HCL 4 MG PO TABS: 4.0000 mg | ORAL_TABLET | Freq: Four times a day (QID) | ORAL | 11 refills | Status: DC | PRN

## 2017-10-27 NOTE — Patient Instructions (Addendum)
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 Dr. Niel Hummer and Dr. Laurence Spates.  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 - MRI cervical spine

## 2017-10-27 NOTE — Progress Notes (Addendum)
[facility]    Provider:  Dr Jaynee Eagles Referring Provider: Yvone Neu,* Primary Care Physician:  Yvone Neu, MD  CC: Migraines  Interval History  11/24/2017: Patient continues to complain of daily headaches. She wakes with them in the morning, worse positionally and with valsalva, vision changes. Concerning for occipital location. I believe she needs an MRI brain due to concerning symptoms of morning headaches, positional headaches,vision changes  to look for space occupying mass, chiari or intracranial hypertension (pseudotumor). Also Lumbar Puncture.   Interval history 10/27/2017:  She is here for a chronic neck pain under my treatment with conservatives measures for over a year. She has episodes of neck pain happened last November for 10 weeks, this year June she was moving bags of mulch she had a migraine and never went away.  She had pain it moved around 5/10 and was different that her migraines and lasted longer not as responsive to medications. She still has a headache from June. She would wake up at 2am from the headache or neck pain. She has a lot of tighttness in the cervical muscles, intense shoulder and neck massages helping. She also saw a new chiropractor. When she gets massages gets radiating pain down the left arm. She has tried conservative treatments since last November. Dry needling felt good but didn't help also the PT and sill doing PT stretches. Very tight cervical muscles. Decreased range of motion in the neck, stiffness and pain, progressive.  Interval history : The Saturday before father's day she started having headaches but they are different, moves around, pain in her eye socket and eye balls and can radiate all the way down to the back of the neck, hurts at her jawline on either side. Heavy, tight as opposed to migrainous (light/sound, pulsating, etc). She can go to bed at night and wake up with one or it cam develop during the day.  She has had a headache for 3 weeks.  No new medications, no changes in diet. She went off the prozac for several weeks prior to symptoms but did not coincide and not better after restarting botox. She had similar symptoms when coming off of caffeine in the past but resolved. She restarted prozac to see if this was the cause, she did a prednisone pack, no improvement. Can be continuous all day long. They fluctuate from a 1-4/10 on the pain scale. Has lasted consecutively for days. She is having difficulty sleeping through the night, she is on an estrogen patch. Had a migraine initially the first day of the headache, no recent illnesses, no focal neurologic deficits. She has been having neck tightness. She has tried seroquel, phenergan, toradol and her acute management is not working. Needs better acute management. Husband is here as well.   Interval History 11/14/2015: Patient is here for evaluation of migraine since August 9th. She has an extensive history since the age of 3 and is currently receiving botox therapy(see HPI below). The migraine has been waxing and waning but continuous.  Patient has failed a plethora of medications in the past (see below at original HPI for list), tried multiple abortive therapies, she has been here in clinic for migraine cocktails(depakote, steroids, compazine, toradol)  as well as IM DHE and lidocaine/marcaine nerve blocks.  We have prescribed multiple medications including: (Migrainal, Naproxen, Zofran, DHE, Meloxicam, Imitrex in various formulations including Onzetra, Relpax, Frova , baclofen, Stadol, methergine, prednisone packs, magnesium).  She has had a headache since August 9th and nothing  is helping. Have advised her to be aware of medication overuse headache, patient is very educated and high functioning and she is very aware of rebound headache. Husband is an Forensic psychologist.  She was in bed all day yesterday. She also has an extensive history of failed medications (please see  below) and we have recently initiated Trokendi again in the hopes she may respond. I suggested Depakote as this is something she has never tried but she is uncomfortable with the side effects.  Discussed the following possible changes to her birth control as well ans we have discussed referral to headache clinic:  Effective COCs for MM or MRM reduce the fall in estrogen to 10ucg or less Specific formulations include: Lybrel (365d) which is now generic as Set designer (generic w more breakthough bleeding) Lo-Seasonique 84d(regular seasonique has a drop of 7mcg from 30 to 50mcg which is too much) Lo loestrin 1/10 FE NuvaRing 20mcg, keep in 4wk, then replace. If wants to cycle, can use a 0.075mg  estrogen patch during the week it is out   Keep in mind that the pill is still daily spikes of estrogen at the time you take the pill NuvaRing provides the most steady, consistent estrogen levels  Intreval history 08/04/2014: April 25th headaches worsened. Nothing happened around that time. She has 2 menses in the month of may. She has had a migraine almost every day since then, status migrainosus. She takes imitrex with naproxen at the onset of her headache. She has tried [place] zembrace and other triptans and they don't work any better than   Interval update 02/13/2015: She is feeling better. The frequency and severity has decreased. She is coming back January 9th for botox. Discussed cgrp trial. But she has a low-grade headache daily. Responsive to sumatriptan. No medication overuse.  HPI 01/10/2015: Jenna Fitzgerald is a 47 y.o. female here as a referral from Dr. Zigmund Daniel for migraines. She has had them since she was 24. She has had them daily, at one point 18 months continuously. Botox helped in the past as did progesterone. She has had Botox every 3 months for 2 years. She takes progesterone. She needs occipital nerve blocks as needed, botox every 3 months, meloxicam daily. In may she was doing well and  stopped her BCP and lexapro and then the migraines worsened. She is still in the cycle of trying to get her headaches back in control. On the botox she was down to 2 headaches a month but now having daily migraines, they can last up to 24 hours, She has light and sound sensitivity, she has left sided throbbing/pulsating, no daily OTC medications or overuse component. The daily headaches since June. She has failed Topiramate , gabapentin, meloxicam, propranolol, topiramate. She did not tolerate propranolol. She is currently on Lexapro. The last time she had botox was October 13th. She has nausea, no vomiting with the headaches, can be up to 8/10. Imitrex sometimes works. Husband has had vasctomy so she is not planning on anymore children. She has 30 headache days per month all of which are migrainous and last up to 24 hours. No aura. She also has fatigue and depression. She does not use caffeine, she keeps a headache diary without food triggers noted, she uses Cephaly daily and does yoga and cardio.  She is on Verapamil, lexapro, imitrex oral.   Reviewed notes, labs and imaging from outside physicians, which showed: headaches began at the age of 45 during her menses. Gradually increased to daily migraines and once  had them for 18 months consecutively. In June 2016 she stopped the lexapro and BCP and migraines worsened from 2/month to 30.month.  July 31 restarted BCP.  Aug 15 restarted lexapro and noticed improvement.  Last botox October 13 Oct 15: changed from bcp to progesterone, stopped caffeine. Daily cephaly use.  meds tried before: Amitriptyline  Anaprox 550 fioricet Diazepam 10mg  camrese cephadyn Codeine Gabapentin 600mg  introvale jolessa Melatonin meloxicam Methylergonovine migra-eeze prometrium Propranolol topiramate Tri-sprintec trokendi c blocks, trigger points, trigeninal blocks, occipital nerve block  11/16/2012: MRi norma 01/05/2007: carotids and echo nml Review of  Systems: Patient complains of symptoms per HPI as well as the following symptoms: No CP, no SOB. Pertinent negatives per HPI. All others negative.   Social History   Socioeconomic History  . Marital status: Married    Spouse name: Phineas Douglas"   . Number of children: 1  . Years of education: 56  . Highest education level: Not on file  Occupational History  . Occupation: Research scientist (life sciences) [employer]  Social Needs  . Financial resource strain: Not on file  . Food insecurity:    Worry: Not on file    Inability: Not on file  . Transportation needs:    Medical: Not on file    Non-medical: Not on file  Tobacco Use  . Smoking status: Former Smoker    Last attempt to quit: 03/04/2004    Years since quitting: 13.6  . Smokeless tobacco: Never Used  Substance and Sexual Activity  . Alcohol use: Yes    Alcohol/week: 0.0 standard drinks    Comment: Rare  . Drug use: No  . Sexual activity: Not on file  Lifestyle  . Physical activity:    Days per week: Not on file    Minutes per session: Not on file  . Stress: Not on file  Relationships  . Social connections:    Talks on phone: Not on file    Gets together: Not on file    Attends religious service: Not on file    Active member of club or organization: Not on file    Attends meetings of clubs or organizations: Not on file    Relationship status: Not on file  . Intimate partner violence:    Fear of current or ex partner: Not on file    Emotionally abused: Not on file    Physically abused: Not on file    Forced sexual activity: Not on file  Other Topics Concern  . Not on file  Social History Narrative   Lives at home with husband and son   Caffeine use:  Drinks decaf drinks    Right handed    Family History  Problem Relation Age of Onset  . Diabetes Father   . Non-Hodgkin's lymphoma Mother        Pancreas  . Migraines Neg Hx     Past Medical History:  Diagnosis Date  . Cervical syndrome   . Cervicocranial syndrome   .  Chronic mixed headache syndrome   . Gastroenteritis   . Hay fever   . Irregular periods   . Menopausal syndrome   . Neck pain   . Premenstrual tension syndrome   . Refractory migraine   . Seasonal allergies   . Trigeminal neuralgia     Past Surgical History:  Procedure Laterality Date  . LAPAROSCOPIC TOTAL HYSTERECTOMY  09/09/2016  . SYMPATHECTOMY Bilateral     Current Outpatient Medications  Medication Sig Dispense Refill  . BOTOX 200  units SOLR ADMINISTER IN PHYSICIAN'S OFICE. 1 each 2  . Diclofenac Potassium (ZIPSOR) 25 MG CAPS Take 50 mg by mouth as needed.    Eduard Roux (AIMOVIG) 140 MG/ML SOAJ Inject 1 pen into the skin every 30 (thirty) days.    Marland Kitchen estradiol (VIVELLE-DOT) 0.1 MG/24HR patch Place onto the skin 2 (two) times a week. Apply and remove 1 patch transdermal twice weekly  12  . ketorolac (TORADOL) 10 MG tablet TAKE 2 TABS MAXIMUM IN A DAY AS NEEDED FOR SEVERE MIGRAINE RESCUE THERAPY USE 2 TIMES IN A MONTH 20 tablet 11  . ketorolac (TORADOL) 60 MG/2ML SOLN injection Inject 30 mg into the muscle every 12 hours as needed for headache. Max 2 doses in 24 hours. Do not use than 4 days in a row. 8 mL 0  . Loratadine 10 MG CAPS Take 10 mg by mouth daily.    . naproxen sodium (ANAPROX) 550 MG tablet Take 1 tablet (550 mg total) by mouth every 12 (twelve) hours as needed. 60 tablet 12  . OnabotulinumtoxinA (BOTOX IJ) Inject 1 Dose as directed every 3 (three) months.    . promethazine (PHENERGAN) 25 MG tablet TAKE 1 TAB MAXIMUM IN A DAY AS NEEDED FOR SEVERE MIGRAINE RESCUE THERAPY MAX USE IS 2 TIMES A MONTH  3  . SUMAtriptan (IMITREX) 100 MG tablet Take 1 tablet (100 mg total) by mouth 2 (two) times daily as needed. 30 tablet 12  . ALPRAZolam (XANAX) 0.5 MG tablet Take 1 tablet (0.5 mg total) by mouth as needed for anxiety (for sedation before MRI scan; take 1 hour before scan; may repeat 15 min before scan). 3 tablet 0  . BD ECLIPSE SYRINGE 23G X 1" 3 ML MISC USE 1 SYRINGE  WITH KETOROLAC INJECTION EVERY 12 HOURS AS NEEDED FOR HEADACHE 8 each 11  . tiZANidine (ZANAFLEX) 4 MG tablet Take 1 tablet (4 mg total) by mouth every 6 (six) hours as needed for muscle spasms. 90 tablet 11   No current facility-administered medications for this visit.     Allergies as of 10/27/2017 - Review Complete 10/27/2017  Allergen Reaction Noted  . Stadol [butorphanol]  11/14/2015    Vitals: BP 94/61 (BP Location: Right Arm, Patient Position: Sitting)   Pulse 78   Ht 5\' 3"  (1.6 m)   Wt 123 lb (55.8 kg)   BMI 21.79 kg/m  Last Weight:  Wt Readings from Last 1 Encounters:  10/27/17 123 lb (55.8 kg)   Last Height:   Ht Readings from Last 1 Encounters:  10/27/17 5\' 3"  (1.6 m)   Physical exam: Exam: Gen: NAD, conversant, well nourised, obese, well groomed                     CV: RRR, no MRG. No Carotid Bruits. No peripheral edema, warm, nontender Eyes: Conjunctivae clear without exudates or hemorrhage MSK: Neck: Hypertrophied cervical muscles, elevated left shoulder, left laterocollis and right torticollis, decreased range of motion of the neck  Neuro: Detailed Neurologic Exam  Speech:    Speech is normal; fluent and spontaneous with normal comprehension.  Cognition:    The patient is oriented to person, place, and time;     recent and remote memory intact;     language fluent;     normal attention, concentration,     fund of knowledge Cranial Nerves:    The pupils are equal, round, and reactive to light. The fundi are normal and spontaneous venous pulsations are  present. Visual fields are full to finger confrontation. Extraocular movements are intact. Trigeminal sensation is intact and the muscles of mastication are normal. The face is symmetric. The palate elevates in the midline. Hearing intact. Voice is normal. Shoulder shrug is normal. The tongue has normal motion without fasciculations.   Coordination:    Normal finger to nose and heel to shin. Normal rapid  alternating movements.   Gait:    Heel-toe and tandem gait are normal.   Motor Observation:   H ypertrophied cervical muscles, left laterocollis and right torticollis, elevated eft shoulder, decreased range of motion of the neck, dystonic tremor Tone:    Normal muscle tone.    Posture:    Posture is normal. normal erect    Strength:    Strength is V/V in the upper and lower limbs.      Sensation: intact to LT     Reflex Exam:  DTR's:    Deep tendon reflexes in the upper and lower extremities are normal bilaterally.   Toes:    The toes are downgoing bilaterally.   Clonus:    Clonus is absent.   Assessment/Plan:  Lovely high-functioning 47 year old with intractable migraines, tried and failed over 30 preventative and aborive medications, is on botox, aimovig, had hysterectomy to try and help the migraines. Today she comes back with intractable headache/migraine, chronic neck pain and dystonia  MRI brain due to concerning symptoms of morning headaches, positional headaches,vision changes  to look for space occupying mass, chiari or intracranial hypertension (pseudotumor). Also needs LP after imaging.   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 Sometimes makes it difficult to drive as has to move entire body to look, worsening, affecting her life in many ways as example above. Recommend dysport for cervical dystonia/spasmodic torticollis to improve pain and range of motion.   Plan: 100 left  levator scapula. 200 left trapezius,40 right semispinalis capitus, 40left splenius capitus, 40 left semispinalis capitus, 80 left sternoclidomastoid  See details on all medications tried and failed in interval history today and also original HPI which is extensive.   Chronic neck pain and radiculopathy, left prox arm weakness: MRI cervical spine, she has failed PT, medications (muscle relaxers, analgesics), dry needling and multiple other modalities under my care for the last year she needs MRI cervical spine to evaluate for cervical stenosis, radiculopathy, degenerative disease for surgical or other invasive treatments such as ESI  Occipital neuralgia/headacheas and neck pain: Will refer to Dr. Niel Hummer for c2/c3 medial branch blocks:- Medial Branch Blocks c2/c3 to try and help with the occipital headaches and if that helps consider radiofrequency ablation. Dr. Niel Hummer and Dr. Laurence Spates.recommended will try Dr. Niel Hummer first and hope she takes patient's insurance if not will try Dr. Laurence Spates. -Also recommend Dysport for spasmodic torticollis, try Tizanidine (has been refractory to oral medications)  Migraines: Continue current medications for now  Meds ordered this encounter  Medications  . tiZANidine (ZANAFLEX) 4 MG tablet    Sig: Take 1 tablet (4 mg total) by mouth every 6 (six) hours as needed for muscle spasms.  Dispense:  90 tablet    Refill:  11    Orders Placed This Encounter  Procedures  . MR CERVICAL SPINE WO CONTRAST  . Ambulatory referral to [place], MD  [facility] [address] [place], [place] [address]  Phone [phone] Fax [phone]  A total of 40 minutes was spent face-to-face with this patient. Over half this time was spent on counseling patient on the  1. Spasmodic torticollis   2. Cervical dystonia   3. Cervical radiculopathy   4. Chronic neck pain   5. Arm weakness     6. Neck pain   7. Occipital headache   8. Bilateral occipital neuralgia     diagnosis and different diagnostic and therapeutic options available.    Sarina Ill, MD  [facility] [address] [place], [place] [address]

## 2017-10-28 ENCOUNTER — Telehealth: Payer: Self-pay | Admitting: Neurology

## 2017-10-28 MED ORDER — ALPRAZOLAM 0.5 MG PO TABS: 0.5000 mg | ORAL_TABLET | ORAL | 0 refills | Status: DC | PRN

## 2017-10-28 NOTE — Telephone Encounter (Signed)
MR Cervical spine wo contrast Dr. Ihor Dow Auth: [id number] (exp. 10/28/17 to 11/26/17). Patient is scheduled at [facility] for 11/05/17.  Patient informed me she is claustrophobic and would like something to help her.

## 2017-10-28 NOTE — Telephone Encounter (Signed)
[facility] confirmation received [facility] [place] [phone]. Xanax for MRI. sy

## 2017-10-28 NOTE — Telephone Encounter (Signed)
Xanax sent in. -VRP

## 2017-11-03 ENCOUNTER — Telehealth: Payer: Self-pay | Admitting: Neurology

## 2017-11-03 ENCOUNTER — Encounter: Payer: Self-pay | Admitting: Neurology

## 2017-11-03 DIAGNOSIS — M542 Cervicalgia: Secondary | ICD-10-CM

## 2017-11-03 DIAGNOSIS — G243 Spasmodic torticollis: Secondary | ICD-10-CM | POA: Insufficient documentation

## 2017-11-03 DIAGNOSIS — G8929 Other chronic pain: Secondary | ICD-10-CM | POA: Insufficient documentation

## 2017-11-03 NOTE — Telephone Encounter (Signed)
Jenna Fitzgerald: Can you start a request for Dysport 500 units for cervical dystonia for patient please?  Jenna Fitzgerald, can you give me a form to fill out please?  Jenna Fitzgerald: I am going to discuss a referral to Dr. Niel Hummer with you, thanks  She has hypertrophied cervical muscles, left laterocollis and right torticollis, elevated eft shoulder, decreased range of motion of the neck, dystonic tremor, progressive despite multiple medications and treatments; she has failed PT, medications (muscle relaxers, analgesics), dry needling and multiple other modalities

## 2017-11-04 ENCOUNTER — Encounter: Payer: Self-pay | Admitting: *Deleted

## 2017-11-04 NOTE — Telephone Encounter (Signed)
Dysport packet completed and ready for MD signature.

## 2017-11-04 NOTE — Telephone Encounter (Signed)
Hinton Dyer, I contacted Dr. Ace Gins if you can send the referral thanks!

## 2017-11-05 ENCOUNTER — Ambulatory Visit (INDEPENDENT_AMBULATORY_CARE_PROVIDER_SITE_OTHER): Payer: BLUE CROSS/BLUE SHIELD

## 2017-11-05 DIAGNOSIS — R29898 Other symptoms and signs involving the musculoskeletal system: Secondary | ICD-10-CM

## 2017-11-05 DIAGNOSIS — M542 Cervicalgia: Secondary | ICD-10-CM

## 2017-11-05 DIAGNOSIS — G243 Spasmodic torticollis: Secondary | ICD-10-CM | POA: Diagnosis not present

## 2017-11-05 DIAGNOSIS — M5412 Radiculopathy, cervical region: Secondary | ICD-10-CM

## 2017-11-05 DIAGNOSIS — G8929 Other chronic pain: Secondary | ICD-10-CM

## 2017-11-05 NOTE — Telephone Encounter (Signed)
Referral has been sent to Dr. Ace Gins

## 2017-11-11 NOTE — Telephone Encounter (Signed)
BCBS paperwork has been sent over.

## 2017-11-12 ENCOUNTER — Other Ambulatory Visit: Payer: Self-pay | Admitting: Neurology

## 2017-11-12 MED ORDER — ABOBOTULINUMTOXINA 500 UNITS IM SOLR: INTRAMUSCULAR | 3 refills | Status: DC

## 2017-11-12 NOTE — Telephone Encounter (Signed)
Order placed for the Dysport 500 units.

## 2017-11-12 NOTE — Telephone Encounter (Signed)
I received a fax today stating I needed to reach out to [facility]. I called the provider service line, held for 52 minutes, was transferred 4 times, then the call was disconnected. I called and held for another 15 minutes and spoke with a representative, Heather. She stated that [id number] is a covered service and needs precertification. I was transferred to medical services team for pre cert. I spoke with Donte who initiated the request for me. YLU#[id number]. I spoke with the nurse reviewer Inez Catalina and gave clinicals. Case was approved through 10/13/18 plus 4 office visits.   Romelle Starcher could you send script over to [facility] for the Dysport 500 units?

## 2017-11-12 NOTE — Addendum Note (Signed)
Addended by: Gildardo Griffes on: 11/12/2017 11:21 AM   Modules accepted: Orders

## 2017-11-24 NOTE — Addendum Note (Signed)
Addended by: Sarina Ill B on: 11/24/2017 06:42 PM   Modules accepted: Orders

## 2017-11-25 ENCOUNTER — Telehealth: Payer: Self-pay | Admitting: Neurology

## 2017-11-25 NOTE — Telephone Encounter (Signed)
spoke to pt she will call back to schedule [facility] Auth: [id number] (exp. 11/25/17 to 12/24/17)

## 2017-11-26 NOTE — Telephone Encounter (Signed)
MR Brain w/wo contrast Dr. Ihor Dow Auth: [id number] (exp. 11/25/17 to 12/24/17). Patient is scheduled for 12/03/17 at [facility].

## 2017-12-01 ENCOUNTER — Telehealth: Payer: Self-pay | Admitting: Neurology

## 2017-12-01 NOTE — Telephone Encounter (Signed)
Pt has called to see if there are any earlier appointments for botox before 10-25 please call

## 2017-12-02 NOTE — Telephone Encounter (Signed)
I called and scheduled the patient.  °

## 2017-12-03 ENCOUNTER — Ambulatory Visit (INDEPENDENT_AMBULATORY_CARE_PROVIDER_SITE_OTHER): Payer: BLUE CROSS/BLUE SHIELD

## 2017-12-03 DIAGNOSIS — R519 Headache, unspecified: Secondary | ICD-10-CM

## 2017-12-03 DIAGNOSIS — H539 Unspecified visual disturbance: Secondary | ICD-10-CM | POA: Diagnosis not present

## 2017-12-03 DIAGNOSIS — R51 Headache with orthostatic component, not elsewhere classified: Secondary | ICD-10-CM

## 2017-12-03 DIAGNOSIS — R29898 Other symptoms and signs involving the musculoskeletal system: Secondary | ICD-10-CM

## 2017-12-03 DIAGNOSIS — G8929 Other chronic pain: Secondary | ICD-10-CM

## 2017-12-03 MED ORDER — GADOBENATE DIMEGLUMINE 529 MG/ML IV SOLN
10.0000 mL | Freq: Once | INTRAVENOUS | Status: AC | PRN
  Administered 2017-12-03: 10 mL via INTRAVENOUS

## 2017-12-10 NOTE — Telephone Encounter (Signed)
I called and spoke with the patient and informed her the PA had already been completed.   I called the and spoke with the pharmacy regarding this and they are still working on verifying benefits. I explained the script had been sent over towards the beginning of September, the representative said that all she could do was send an email.

## 2017-12-23 ENCOUNTER — Telehealth: Payer: Self-pay | Admitting: Neurology

## 2017-12-23 ENCOUNTER — Other Ambulatory Visit: Payer: Self-pay | Admitting: Neurology

## 2017-12-23 DIAGNOSIS — I677 Cerebral arteritis, not elsewhere classified: Secondary | ICD-10-CM

## 2017-12-23 DIAGNOSIS — R51 Headache: Principal | ICD-10-CM

## 2017-12-23 DIAGNOSIS — I776 Arteritis, unspecified: Secondary | ICD-10-CM

## 2017-12-23 DIAGNOSIS — G8929 Other chronic pain: Secondary | ICD-10-CM

## 2017-12-23 DIAGNOSIS — R519 Headache, unspecified: Secondary | ICD-10-CM

## 2017-12-23 NOTE — Telephone Encounter (Signed)
[employer] Auth: [id number] (exp. 12/23/17 to 01/21/18) order sent to [facility]. Patient is aware of this. She also has their number of [phone] to call to schedule CT. She is also aware to call Roberta at [phone] for her LP.

## 2017-12-23 NOTE — Telephone Encounter (Signed)
I have emailed the patient back, per our last conversation she was trying to find support for co pay assistance. DW

## 2017-12-23 NOTE — Telephone Encounter (Signed)
I called [facility], they stated that they have received the PA and there was a high co pay ([phone]). It is only pending patient consent, they are awaiting patient consent and it is ready to ship once that has been received. They have been trying to call the patient. I spoke with Hoyle Sauer.

## 2017-12-23 NOTE — Telephone Encounter (Signed)
Jenna Fitzgerald, patient is asking for an email from you with an update on the dysport. Thanks!

## 2017-12-26 ENCOUNTER — Ambulatory Visit: Payer: BLUE CROSS/BLUE SHIELD | Admitting: Neurology

## 2018-01-02 ENCOUNTER — Encounter: Payer: Self-pay | Admitting: Neurology

## 2018-01-02 ENCOUNTER — Ambulatory Visit (INDEPENDENT_AMBULATORY_CARE_PROVIDER_SITE_OTHER): Payer: BLUE CROSS/BLUE SHIELD | Admitting: Neurology

## 2018-01-02 VITALS — BP 107/65 | HR 73 | Ht 63.0 in | Wt 120.8 lb

## 2018-01-02 DIAGNOSIS — M542 Cervicalgia: Secondary | ICD-10-CM | POA: Diagnosis not present

## 2018-01-02 DIAGNOSIS — M7918 Myalgia, other site: Secondary | ICD-10-CM | POA: Diagnosis not present

## 2018-01-02 DIAGNOSIS — G44209 Tension-type headache, unspecified, not intractable: Secondary | ICD-10-CM | POA: Diagnosis not present

## 2018-01-02 DIAGNOSIS — G8929 Other chronic pain: Secondary | ICD-10-CM

## 2018-01-02 DIAGNOSIS — G43711 Chronic migraine without aura, intractable, with status migrainosus: Secondary | ICD-10-CM

## 2018-01-02 NOTE — Progress Notes (Signed)
Patient has had exceptional response to botox for her migraines, only 2 migraines since last botox. However she has a new problem daily tension-type headache and neck pain. She has continuous tension-type headache and neck pain. This started mid summer. Discussed etiology, possibly due to [place] asked her to stop and gave her emgality samples to hold onto. Stop aimovig and follow clinically if migraines worsen but the headaches improve then start emgaity. I am not sure if there are any inpatient headace programs, will research. She would like referrals to both [facility] and [facility] we will wait for the results of her LP before proceeding. Discussed other options for her neck pain and tension type headaches, will try Sphenocath weekly for 4-6 weeks, then denervaton of the sphenopalatine ganglion   A total of 25 minutes was spent face-to-face with this patient. Over half this time was spent on counseling patient on the tension headache and emotional tension, myofascial cervical neck paoin,chronic  neck pain diagnosis and different diagnostic and therapeutic options, counseling and coordination of care, risks ans benefits of management, compliance, or risk factor reduction and education.  This does not include time spent on botulinum injections.

## 2018-01-02 NOTE — Progress Notes (Signed)
**  Dysport 500 units x 1 vial, NDC [address]-1, Lot [id number], Exp 08/02/2018, specialty pharmacy.//mck,rn** 

## 2018-01-06 ENCOUNTER — Ambulatory Visit (INDEPENDENT_AMBULATORY_CARE_PROVIDER_SITE_OTHER): Payer: BLUE CROSS/BLUE SHIELD | Admitting: Neurology

## 2018-01-06 DIAGNOSIS — G43011 Migraine without aura, intractable, with status migrainosus: Secondary | ICD-10-CM

## 2018-01-06 NOTE — Progress Notes (Signed)
Sphenocath procedure  Consent signed  2% Lidocaine 6 mL total LOT: [id number] EXP: 10/03/2018

## 2018-01-07 ENCOUNTER — Ambulatory Visit: Payer: Self-pay | Admitting: Neurology

## 2018-01-07 NOTE — Progress Notes (Signed)
Procedure: The patient was placed in the supine position. A temperature strip was added to the cheek area after the area was cleaned with alcohol. The Sphenocath was lubricated with gel, and placed in the right naris. The catheter was inserted above the middle turbinate to the posterior nasal cavity, and then withdrawn 1 cm. The catheter was deployed and rotated approximately 20 towards the nose. 2-1/2 mL of 2% lidocaine was deployed. The patient was asked to swallow during the injection. The patient demonstrated erythema of the sclera of the eye on this side, and an increase in the cheek temperature was noted from 72F to 100 F.  This process was repeated on the left side, with similar results. The increase in cheek temperature was documented from 75 F to 100 F.  The patient tolerated the procedure well. No complications of the procedure were noted. The patient was kept in the supine position for 8 minutes following the procedure. She was given small sips of water after sitting up following the procedure.  Lidocaine 2% NDC 63817-711-65  Jenna Fitzgerald

## 2018-01-08 NOTE — Progress Notes (Signed)
Consent Form Botulism Toxin Injection For Chronic Migraine    Reviewed orally with patient, additionally signature is on file:  Botulism toxin has been approved by the Federal drug administration for treatment of chronic migraine. Botulism toxin does not cure chronic migraine and it may not be effective in some patients.  The administration of botulism toxin is accomplished by injecting a small amount of toxin into the muscles of the neck and head. Dosage must be titrated for each individual. Any benefits resulting from botulism toxin tend to wear off after 3 months with a repeat injection required if benefit is to be maintained. Injections are usually done every 3-4 months with maximum effect peak achieved by about 2 or 3 weeks. Botulism toxin is expensive and you should be sure of what costs you will incur resulting from the injection.  The side effects of botulism toxin use for chronic migraine may include:   -Transient, and usually mild, facial weakness with facial injections  -Transient, and usually mild, head or neck weakness with head/neck injections  -Reduction or loss of forehead facial animation due to forehead muscle weakness  -Eyelid drooping  -Dry eye  -Pain at the site of injection or bruising at the site of injection  -Double vision  -Potential unknown long term risks  Contraindications: You should not have Botox if you are pregnant, nursing, allergic to albumin, have an infection, skin condition, or muscle weakness at the site of the injection, or have myasthenia gravis, Lambert-Eaton syndrome, or ALS.  It is also possible that as with any injection, there may be an allergic reaction or no effect from the medication. Reduced effectiveness after repeated injections is sometimes seen and rarely infection at the injection site may occur. All care will be taken to prevent these side effects. If therapy is given over a long time, atrophy and wasting in the muscle injected may  occur. Occasionally the patient's become refractory to treatment because they develop antibodies to the toxin. In this event, therapy needs to be modified.  I have read the above information and consent to the administration of botulism toxin.    PROCEDURE NOTE FOR MIGRAINE HEADACHE    Contraindications and precautions discussed with patient(above). Aseptic procedure was observed and patient tolerated procedure. Procedure performed by Dr. Georgia Dom  The condition has existed for more than 6 months, and pt does not have a diagnosis of ALS, Myasthenia Gravis or Lambert-Eaton Syndrome.  Risks and benefits of injections discussed and pt agrees to proceed with the procedure.  Written consent obtained  These injections are medically necessary. Pt  receives good benefits from these injections. These injections do not cause sedations or hallucinations which the oral therapies may cause.  Description of procedure:  The patient was placed in a sitting position. The standard protocol was used for Botox as follows, with 5 units of Botox injected at each site:   -Procerus muscle, midline injection  -Corrugator muscle, bilateral injection  -Frontalis muscle, bilateral injection, with 2 sites each side, medial injection was performed in the upper one third of the frontalis muscle, in the region vertical from the medial inferior edge of the superior orbital rim. The lateral injection was again in the upper one third of the forehead vertically above the lateral limbus of the cornea, 1.5 cm lateral to the medial injection site.  - Levator Scapulae: 5 units bilaterally  -Temporalis muscle injection, 5 sites, bilaterally. The first injection was 3 cm above the tragus of the ear, second  injection site was 1.5 cm to 3 cm up from the first injection site in line with the tragus of the ear. The third injection site was 1.5-3 cm forward between the first 2 injection sites. The fourth injection site was 1.5 cm  posterior to the second injection site. 5th site laterally in the temporalis  muscleat the level of the outer canthus.  - Patient feels her clenching is a trigger for headaches. +5 units masseter bilaterally   - Patient feels the migraines are centered around the eyes +5 units bilaterally at the outer canthus in the orbicularis occuli  -Occipitalis muscle injection, 3 sites, bilaterally. The first injection was done one half way between the occipital protuberance and the tip of the mastoid process behind the ear. The second injection site was done lateral and superior to the first, 1 fingerbreadth from the first injection. The third injection site was 1 fingerbreadth superiorly and medially from the first injection site.  -Cervical paraspinal muscle injection, 2 sites, bilateral knee first injection site was 1 cm from the midline of the cervical spine, 3 cm inferior to the lower border of the occipital protuberance. The second injection site was 1.5 cm superiorly and laterally to the first injection site.  -Trapezius muscle injection was performed at 3 sites, bilaterally. The first injection site was in the upper trapezius muscle halfway between the inflection point of the neck, and the acromion. The second injection site was one half way between the acromion and the first injection site. The third injection was done between the first injection site and the inflection point of the neck.   Will return for repeat injection in 3 months.   A 200 unit sof Botox was used, any Botox not injected was wasted. The patient tolerated the procedure well, there were no complications of the above procedure.

## 2018-01-09 ENCOUNTER — Ambulatory Visit
Admission: RE | Admit: 2018-01-09 | Discharge: 2018-01-09 | Disposition: A | Discharge status: Home / Self Care | Payer: BLUE CROSS/BLUE SHIELD | Source: Ambulatory Visit | Attending: Neurology | Admitting: Neurology

## 2018-01-09 VITALS — BP 104/79 | HR 62

## 2018-01-09 DIAGNOSIS — G8929 Other chronic pain: Secondary | ICD-10-CM

## 2018-01-09 DIAGNOSIS — I677 Cerebral arteritis, not elsewhere classified: Secondary | ICD-10-CM

## 2018-01-09 DIAGNOSIS — R51 Headache: Secondary | ICD-10-CM

## 2018-01-09 DIAGNOSIS — I776 Arteritis, unspecified: Secondary | ICD-10-CM

## 2018-01-09 DIAGNOSIS — G43709 Chronic migraine without aura, not intractable, without status migrainosus: Secondary | ICD-10-CM

## 2018-01-09 LAB — CSF CELL COUNT WITH DIFFERENTIAL
RBC Count, CSF: 1 cells/uL (ref 0–10)
WBC, CSF: 1 cells/uL (ref 0–5)

## 2018-01-09 LAB — GLUCOSE, CSF: Glucose, CSF: 59 mg/dL (ref 40–80)

## 2018-01-09 LAB — PROTEIN, CSF: Total Protein, CSF: 31 mg/dL (ref 15–45)

## 2018-01-09 IMAGING — CT CT ANGIO HEAD
1 of 5 series · 3 of 30 positions shown · IV contrast (iopamidol)
Comparison: Brain MRI [DATE]

CLINICAL DATA: Chronic daily headache for 5 months. Evaluate for
vasculitis

EXAM:
CT ANGIOGRAPHY HEAD
TECHNIQUE: Multidetector CT imaging of the head was performed using the
standard protocol during bolus administration of intravenous
contrast. Multiplanar CT image reconstructions and MIPs were
obtained to evaluate the vascular anatomy.
CONTRAST:  75mL [2L] IOPAMIDOL ([2L]) INJECTION 76%

[Series 6: head angio · axial · 0.42mm/px · z∈[-134,-44]mm · 3 of 60 slices shown]
[im 15/60  brain]
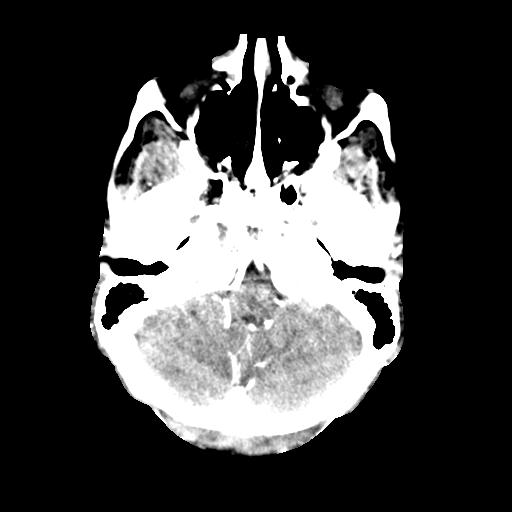
[im 30/60  bone]
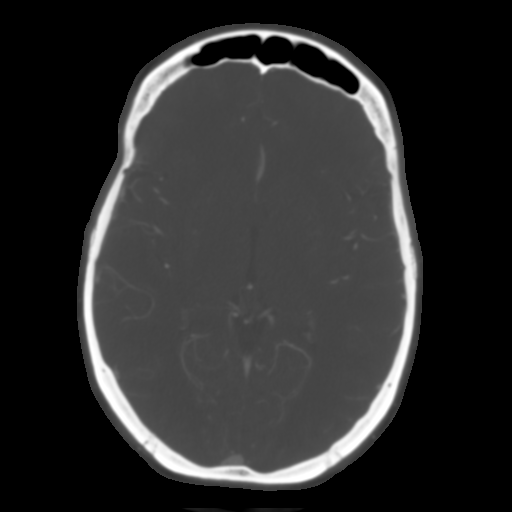
[im 45/60  brain]
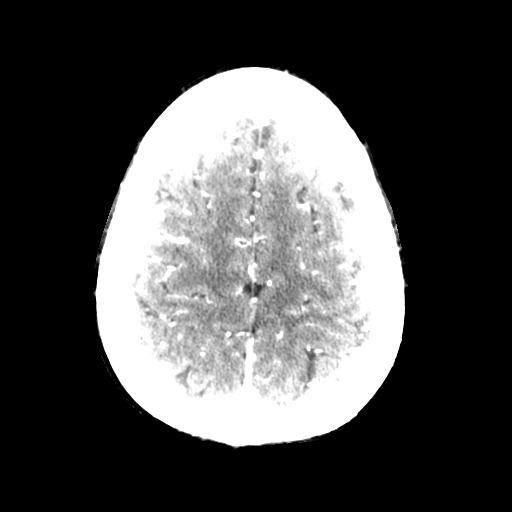

[3 of 30 positions shown; findings below may reference images not displayed]

FINDINGS: CT HEAD

Brain: No evidence of acute infarction, hemorrhage, hydrocephalus,
extra-axial collection or mass lesion/mass effect.

Vascular: See below

Skull: Negative

Sinuses: Negative

Orbits: Negative

CTA HEAD

Anterior circulation: Standard anatomy. Vessels are smooth and
widely patent. No aneurysm or evidence of vascular malformation.

Posterior circulation: Vessels are smooth and widely patent. No
aneurysm or evidence of vascular malformation.

Venous sinuses: Diffusely patent

Anatomic variants: None significant

Delayed phase: No abnormal intracranial enhancement.
IMPRESSION: Normal exam.

## 2018-01-09 MED ORDER — IOPAMIDOL (ISOVUE-370) INJECTION 76%
75.0000 mL | Freq: Once | INTRAVENOUS | Status: AC | PRN
  Administered 2018-01-09: 75 mL via INTRAVENOUS

## 2018-01-09 NOTE — Discharge Instructions (Signed)

## 2018-01-10 ENCOUNTER — Other Ambulatory Visit: Payer: Self-pay | Admitting: Neurology

## 2018-01-10 DIAGNOSIS — R51 Headache with orthostatic component, not elsewhere classified: Secondary | ICD-10-CM

## 2018-03-16 ENCOUNTER — Ambulatory Visit: Payer: BLUE CROSS/BLUE SHIELD | Admitting: Allergy

## 2018-03-30 ENCOUNTER — Ambulatory Visit: Payer: Self-pay | Admitting: Neurology

## 2018-04-02 ENCOUNTER — Ambulatory Visit: Payer: BLUE CROSS/BLUE SHIELD | Admitting: Neurology

## 2018-04-02 ENCOUNTER — Telehealth: Payer: Self-pay | Admitting: Neurology

## 2018-04-02 DIAGNOSIS — G43711 Chronic migraine without aura, intractable, with status migrainosus: Secondary | ICD-10-CM | POA: Diagnosis not present

## 2018-04-02 NOTE — Progress Notes (Signed)
Dysport 500 units x 1 vial LOT: [id number] Expiration: 10/02/2018 NDC: 8727-6184-85  Bacteriostatic 0.9% Sodium Chloride- 52mL total Lot: [id number] Expiration: 12/03/2018 NDC: 4320-0379-44  Dx: C61.901 S/P

## 2018-04-02 NOTE — Telephone Encounter (Signed)
3 mo Dysport injection

## 2018-04-02 NOTE — Progress Notes (Signed)

## 2018-04-10 ENCOUNTER — Ambulatory Visit: Payer: BLUE CROSS/BLUE SHIELD | Admitting: Allergy

## 2018-04-20 NOTE — Telephone Encounter (Signed)
I called the patient insurance and spoke with them about starting a new auth. The rep stated that it was an exclusion under the patients pharmacy benefits. The phone number for medical benefits is [phone]. Will call closer to apt time.

## 2018-04-22 NOTE — Telephone Encounter (Signed)
I called and spoke with the patient and rescheduled her for May 8th. DW

## 2018-06-22 ENCOUNTER — Telehealth: Payer: Self-pay | Admitting: Neurology

## 2018-06-22 NOTE — Telephone Encounter (Signed)
Botox, as I remember she wants to stay with Botox from now on. D/c dysport. thanks

## 2018-06-22 NOTE — Telephone Encounter (Signed)
Patient called and spoke to [facility], she wanted to know which medication we will be using this time. Botox or Dysport? Please advise.

## 2018-07-01 NOTE — Telephone Encounter (Signed)
Noted, thank you. DW  °

## 2018-07-03 ENCOUNTER — Ambulatory Visit: Payer: BLUE CROSS/BLUE SHIELD | Admitting: Neurology

## 2018-07-07 ENCOUNTER — Other Ambulatory Visit: Payer: Self-pay | Admitting: *Deleted

## 2018-07-07 DIAGNOSIS — R51 Headache with orthostatic component, not elsewhere classified: Secondary | ICD-10-CM

## 2018-07-07 NOTE — Telephone Encounter (Signed)
Referral placed per v.o. Dr. Jaynee Eagles. Pt's letter printed and given to Hinton Dyer to include with referral.

## 2018-07-08 NOTE — Telephone Encounter (Signed)
I spoke with the patient who stated she has decided to continue with the Dysport through the end of this calendar year because that is whats already authorized. DW

## 2018-07-08 NOTE — Telephone Encounter (Signed)
I called and spoke with the patient regarding changing her apt due to COVID-19. I confirmed that she has not shown any new symptoms, been exposed to the virus nor been running a fever. I also informed her she would need to wear a mask and gloves.  °

## 2018-07-10 ENCOUNTER — Ambulatory Visit: Payer: BLUE CROSS/BLUE SHIELD | Admitting: Neurology

## 2018-07-20 NOTE — Telephone Encounter (Signed)
I called and left a VM with check in rprotocol and in COVID-19 mask and glove requirements.

## 2018-07-21 ENCOUNTER — Other Ambulatory Visit: Payer: Self-pay

## 2018-07-21 ENCOUNTER — Ambulatory Visit (INDEPENDENT_AMBULATORY_CARE_PROVIDER_SITE_OTHER): Payer: BLUE CROSS/BLUE SHIELD | Admitting: Neurology

## 2018-07-21 VITALS — Temp 97.5°F

## 2018-07-21 DIAGNOSIS — G43711 Chronic migraine without aura, intractable, with status migrainosus: Secondary | ICD-10-CM

## 2018-07-21 NOTE — Progress Notes (Signed)
Dysport- 500 units x 1 vial Lot: [id number] Expiration: 01/02/2019 GTIN [id number]  Bacteriostatic 0.9% Sodium Chloride- 38mL total Lot: [id number] Expiration: 12/03/2018 NDC: 5953-9672-89  Dx: T91.504 S/P

## 2018-07-21 NOTE — Progress Notes (Signed)
Consent Form Botulism Toxin Injection For Chronic Migraine   07/21/2018: Exceptional improvement, only 3 migraine days last month. > 90% improvement.  Reviewed orally with patient, additionally signature is on file:  Botulism toxin has been approved by the Federal drug administration for treatment of chronic migraine. Botulism toxin does not cure chronic migraine and it may not be effective in some patients.  The administration of botulism toxin is accomplished by injecting a small amount of toxin into the muscles of the neck and head. Dosage must be titrated for each individual. Any benefits resulting from botulism toxin tend to wear off after 3 months with a repeat injection required if benefit is to be maintained. Injections are usually done every 3-4 months with maximum effect peak achieved by about 2 or 3 weeks. Botulism toxin is expensive and you should be sure of what costs you will incur resulting from the injection.  The side effects of botulism toxin use for chronic migraine may include:   -Transient, and usually mild, facial weakness with facial injections  -Transient, and usually mild, head or neck weakness with head/neck injections  -Reduction or loss of forehead facial animation due to forehead muscle weakness  -Eyelid drooping  -Dry eye  -Pain at the site of injection or bruising at the site of injection  -Double vision  -Potential unknown long term risks  Contraindications: You should not have Botox if you are pregnant, nursing, allergic to albumin, have an infection, skin condition, or muscle weakness at the site of the injection, or have myasthenia gravis, Lambert-Eaton syndrome, or ALS.  It is also possible that as with any injection, there may be an allergic reaction or no effect from the medication. Reduced effectiveness after repeated injections is sometimes seen and rarely infection at the injection site may occur. All care will be taken to prevent these side  effects. If therapy is given over a long time, atrophy and wasting in the muscle injected may occur. Occasionally the patient's become refractory to treatment because they develop antibodies to the toxin. In this event, therapy needs to be modified.  I have read the above information and consent to the administration of botulism toxin.    PROCEDURE NOTE FOR MIGRAINE HEADACHE    Contraindications and precautions discussed with patient(above). Aseptic procedure was observed and patient tolerated procedure. Procedure performed by Dr. Georgia Dom  The condition has existed for more than 6 months, and pt does not have a diagnosis of ALS, Myasthenia Gravis or Lambert-Eaton Syndrome.  Risks and benefits of injections discussed and pt agrees to proceed with the procedure.  Written consent obtained  These injections are medically necessary. Pt  receives good benefits from these injections. These injections do not cause sedations or hallucinations which the oral therapies may cause.  Description of procedure:  The patient was placed in a sitting position. The standard protocol was used for Botox as follows, with 5 units of Botox injected at each site:   -Procerus muscle, midline injection  -Corrugator muscle, bilateral injection  -Frontalis muscle, bilateral injection, with 2 sites each side, medial injection was performed in the upper one third of the frontalis muscle, in the region vertical from the medial inferior edge of the superior orbital rim. The lateral injection was again in the upper one third of the forehead vertically above the lateral limbus of the cornea, 1.5 cm lateral to the medial injection site.  -Temporalis muscle injection, 5 sites, bilaterally. The first injection was 3 cm above the  tragus of the ear, second injection site was 1.5 cm to 3 cm up from the first injection site in line with the tragus of the ear. The third injection site was 1.5-3 cm forward between the first 2  injection sites. The fourth injection site was 1.5 cm posterior to the second injection site.   -Occipitalis muscle injection, 3 sites, bilaterally. The first injection was done one half way between the occipital protuberance and the tip of the mastoid process behind the ear. The second injection site was done lateral and superior to the first, 1 fingerbreadth from the first injection. The third injection site was 1 fingerbreadth superiorly and medially from the first injection site.  -Cervical paraspinal muscle injection, 2 sites, bilateral knee first injection site was 1 cm from the midline of the cervical spine, 3 cm inferior to the lower border of the occipital protuberance. The second injection site was 1.5 cm superiorly and laterally to the first injection site.  -Trapezius muscle injection was performed at 3 sites, bilaterally. The first injection site was in the upper trapezius muscle halfway between the inflection point of the neck, and the acromion. The second injection site was one half way between the acromion and the first injection site. The third injection was done between the first injection site and the inflection point of the neck.   Will return for repeat injection in 3 months.   A 200 unit sof Botox was used, any Botox not injected was wasted. The patient tolerated the procedure well, there were no complications of the above procedure.

## 2018-10-22 ENCOUNTER — Ambulatory Visit: Payer: BLUE CROSS/BLUE SHIELD | Admitting: Neurology

## 2018-10-23 ENCOUNTER — Ambulatory Visit: Payer: BLUE CROSS/BLUE SHIELD | Admitting: Neurology

## 2018-10-26 ENCOUNTER — Ambulatory Visit: Payer: BLUE CROSS/BLUE SHIELD | Admitting: Neurology

## 2018-10-27 ENCOUNTER — Ambulatory Visit (INDEPENDENT_AMBULATORY_CARE_PROVIDER_SITE_OTHER): Payer: BC Managed Care – PPO | Admitting: Neurology

## 2018-10-27 ENCOUNTER — Other Ambulatory Visit: Payer: Self-pay

## 2018-10-27 VITALS — Temp 96.8°F

## 2018-10-27 DIAGNOSIS — G43711 Chronic migraine without aura, intractable, with status migrainosus: Secondary | ICD-10-CM

## 2018-10-27 MED ORDER — NURTEC 75 MG PO TBDP: 75.0000 mg | ORAL_TABLET | Freq: Every day | ORAL | 6 refills | Status: DC | PRN

## 2018-10-27 NOTE — Patient Instructions (Signed)
[employer]: Patient drug information Access [employer] here. Copyright [phone] [employer] All rights reserved. (For additional information see "[employer]: Drug information") Brand Names: [place]  Nurtec  What is this drug used for?   It is used to treat migraine headaches.  What do I need to tell my doctor BEFORE I take this drug?   If you are allergic to this drug; any part of this drug; or any other drugs, foods, or substances. Tell your doctor about the allergy and what signs you had.   If you have any of these health problems: Kidney disease or liver disease.   If you take any drugs (prescription or OTC, natural products, vitamins) that must not be taken with this drug, like certain drugs that are used for HIV, infections, or seizures. There are many drugs that must not be taken with this drug.   This is not a list of all drugs or health problems that interact with this drug.   Tell your doctor and pharmacist about all of your drugs (prescription or OTC, natural products, vitamins) and health problems. You must check to make sure that it is safe for you to take this drug with all of your drugs and health problems. Do not start, stop, or change the dose of any drug without checking with your doctor.  What are some things I need to know or do while I take this drug?   Tell all of your health care providers that you take this drug. This includes your doctors, nurses, pharmacists, and dentists.   This drug is not meant to prevent or lower the number of migraine headaches you get.   Tell your doctor if you are pregnant, plan on getting pregnant, or are breast-feeding. You will need to talk about the benefits and risks to you and the baby.  What are some side effects that I need to call my doctor about right away?   WARNING/CAUTION: Even though it may be rare, some people may have very bad and sometimes deadly side effects when taking a drug. Tell your doctor or get medical help right  away if you have any of the following signs or symptoms that may be related to a very bad side effect:   Signs of an allergic reaction, like rash; hives; itching; red, swollen, blistered, or peeling skin with or without fever; wheezing; tightness in the chest or throat; trouble breathing, swallowing, or talking; unusual hoarseness; or swelling of the mouth, face, lips, tongue, or throat.  What are some other side effects of this drug?   All drugs may cause side effects. However, many people have no side effects or only have minor side effects. Call your doctor or get medical help if any of these side effects or any other side effects bother you or do not go away:   Upset stomach.   These are not all of the side effects that may occur. If you have questions about side effects, call your doctor. Call your doctor for medical advice about side effects.   You may report side effects to your national health agency.  How is this drug best taken?   Use this drug as ordered by your doctor. Read all information given to you. Follow all instructions closely.   Do not push the tablet out of the foil when opening. Use dry hands to take it from the foil. Place on your tongue and let it dissolve. Water is not needed. Do not swallow it whole. Do  not chew, break, or crush it.   If needed, you may place the tablet under the tongue.   Use right after opening.  What do I do if I miss a dose?   This drug is taken on an as needed basis. Do not take more often than told by the doctor.  How do I store and/or throw out this drug?   Store at room temperature in a dry place. Do not store in a bathroom.   Store in foil pouch until ready for use.   Keep all drugs in a safe place. Keep all drugs out of the reach of children and pets.   Throw away unused or expired drugs. Do not flush down a toilet or pour down a drain unless you are told to do so. Check with your pharmacist if you have questions about the best way to throw  out drugs. There may be drug take-back programs in your area.  General drug facts   If your symptoms or health problems do not get better or if they become worse, call your doctor.   Do not share your drugs with others and do not take anyone else's drugs.   Some drugs may have another patient information leaflet. If you have any questions about this drug, please talk with your doctor, nurse, pharmacist, or other health care provider.   If you think there has been an overdose, call your poison control center or get medical care right away. Be ready to tell or show what was taken, how much, and when it happened.

## 2018-10-27 NOTE — Progress Notes (Signed)
Consent Form Botulism Toxin Injection For Chronic Migraine   10/27/2018: Exceptional improvement, only had to take sumatriptan for one migraine in last 3 months. > 99% improvement in migraines. Her other headaches are improving as well slowly, we think she had a csf leak spontaneous due to heavy lifting, she went to [facility] and also has had blood patches. She has a minor (very minor she can't really even call it a headaches) pressure 20 minutes after she gets up but not significant and she is thrilled.  No masseters, no temples, no LS, no OO.   Reviewed orally with patient, additionally signature is on file:  Botulism toxin has been approved by the Federal drug administration for treatment of chronic migraine. Botulism toxin does not cure chronic migraine and it may not be effective in some patients.  The administration of botulism toxin is accomplished by injecting a small amount of toxin into the muscles of the neck and head. Dosage must be titrated for each individual. Any benefits resulting from botulism toxin tend to wear off after 3 months with a repeat injection required if benefit is to be maintained. Injections are usually done every 3-4 months with maximum effect peak achieved by about 2 or 3 weeks. Botulism toxin is expensive and you should be sure of what costs you will incur resulting from the injection.  The side effects of botulism toxin use for chronic migraine may include:   -Transient, and usually mild, facial weakness with facial injections  -Transient, and usually mild, head or neck weakness with head/neck injections  -Reduction or loss of forehead facial animation due to forehead muscle weakness  -Eyelid drooping  -Dry eye  -Pain at the site of injection or bruising at the site of injection  -Double vision  -Potential unknown long term risks  Contraindications: You should not have Botox if you are pregnant, nursing, allergic to albumin, have an infection, skin condition,  or muscle weakness at the site of the injection, or have myasthenia gravis, Lambert-Eaton syndrome, or ALS.  It is also possible that as with any injection, there may be an allergic reaction or no effect from the medication. Reduced effectiveness after repeated injections is sometimes seen and rarely infection at the injection site may occur. All care will be taken to prevent these side effects. If therapy is given over a long time, atrophy and wasting in the muscle injected may occur. Occasionally the patient's become refractory to treatment because they develop antibodies to the toxin. In this event, therapy needs to be modified.  I have read the above information and consent to the administration of botulism toxin.    PROCEDURE NOTE FOR MIGRAINE HEADACHE    Contraindications and precautions discussed with patient(above). Aseptic procedure was observed and patient tolerated procedure. Procedure performed by Dr. Georgia Dom  The condition has existed for more than 6 months, and pt does not have a diagnosis of ALS, Myasthenia Gravis or Lambert-Eaton Syndrome.  Risks and benefits of injections discussed and pt agrees to proceed with the procedure.  Written consent obtained  These injections are medically necessary. Pt  receives good benefits from these injections. These injections do not cause sedations or hallucinations which the oral therapies may cause.  Description of procedure:  The patient was placed in a sitting position. The standard protocol was used for Botox as follows, with 5 units of Botox injected at each site:   -Procerus muscle, midline injection  -Corrugator muscle, bilateral injection  -Frontalis muscle, bilateral injection,  with 2 sites each side, medial injection was performed in the upper one third of the frontalis muscle, in the region vertical from the medial inferior edge of the superior orbital rim. The lateral injection was again in the upper one third of the  forehead vertically above the lateral limbus of the cornea, 1.5 cm lateral to the medial injection site.  -Temporalis muscle injection, 5 sites, bilaterally. The first injection was 3 cm above the tragus of the ear, second injection site was 1.5 cm to 3 cm up from the first injection site in line with the tragus of the ear. The third injection site was 1.5-3 cm forward between the first 2 injection sites. The fourth injection site was 1.5 cm posterior to the second injection site.   -Occipitalis muscle injection, 3 sites, bilaterally. The first injection was done one half way between the occipital protuberance and the tip of the mastoid process behind the ear. The second injection site was done lateral and superior to the first, 1 fingerbreadth from the first injection. The third injection site was 1 fingerbreadth superiorly and medially from the first injection site.  -Cervical paraspinal muscle injection, 2 sites, bilateral knee first injection site was 1 cm from the midline of the cervical spine, 3 cm inferior to the lower border of the occipital protuberance. The second injection site was 1.5 cm superiorly and laterally to the first injection site.  -Trapezius muscle injection was performed at 3 sites, bilaterally. The first injection site was in the upper trapezius muscle halfway between the inflection point of the neck, and the acromion. The second injection site was one half way between the acromion and the first injection site. The third injection was done between the first injection site and the inflection point of the neck.   Will return for repeat injection in 3 months.   A 200 unit sof Botox was used, any Botox not injected was wasted. The patient tolerated the procedure well, there were no complications of the above procedure.

## 2018-10-27 NOTE — Progress Notes (Signed)
Dysport- 500 units x 1 vial Lot: [id number] Expiration: 01/02/2019 NDC: [id number]  Bacteriostatic 0.9% Sodium Chloride- 64mL total Lot: [id number] Expiration: 12/03/2018 NDC: [id number]  Dx: [id number] S/P

## 2018-11-12 ENCOUNTER — Telehealth: Payer: Self-pay | Admitting: Neurology

## 2018-11-12 NOTE — Telephone Encounter (Signed)
I called to schedule the patient for their next Botox injection but they did not answer so I left a VM asking them to call us back. I went ahead and scheduled her apt for her next injection, when she calls back please confirm this date and time work for her. DW

## 2018-12-21 ENCOUNTER — Other Ambulatory Visit: Payer: Self-pay | Admitting: Neurology

## 2019-01-15 ENCOUNTER — Telehealth: Payer: Self-pay | Admitting: Neurology

## 2019-01-15 NOTE — Telephone Encounter (Signed)
Pt is asking for a call from [place] to r/s her Botox appointment

## 2019-01-18 NOTE — Telephone Encounter (Signed)
I called the patient but she did not answer so I left a VM asking her to call back. Dr. Jaynee Eagles is booked through the remainder of December. The patient could be scheduled with an NP, or move to a January apt. Please offer this if she calls back. DW

## 2019-01-21 ENCOUNTER — Telehealth: Payer: Self-pay | Admitting: Neurology

## 2019-01-21 NOTE — Telephone Encounter (Signed)
Pt has called to ask that Andee Poles calls her to discuss r/s her Botox appointment to January

## 2019-01-21 NOTE — Telephone Encounter (Signed)
I called and spoke to the patient, we rescheduled apt due to her request and spoke about switching her toxin to Botox. DW

## 2019-02-03 ENCOUNTER — Ambulatory Visit: Payer: BC Managed Care – PPO | Admitting: Neurology

## 2019-02-09 ENCOUNTER — Ambulatory Visit: Payer: BC Managed Care – PPO | Admitting: Neurology

## 2019-03-29 ENCOUNTER — Telehealth: Payer: Self-pay

## 2019-03-29 ENCOUNTER — Ambulatory Visit: Payer: BC Managed Care – PPO | Admitting: Neurology

## 2019-03-29 VITALS — Temp 97.6°F

## 2019-03-29 DIAGNOSIS — G43711 Chronic migraine without aura, intractable, with status migrainosus: Secondary | ICD-10-CM | POA: Diagnosis not present

## 2019-03-29 NOTE — Progress Notes (Signed)
Consent Form Botulism Toxin Injection For Chronic Migraine   03/29/2019: Last migraine was christmas day. Still great improveent. She continues to have possible low-pressure headaches. She recently had another blood patch which did not help.     Exceptional improvement, only had to take sumatriptan for one migraine in last 3 months. > 99% improvement in migraines. Her other headaches are improving as well slowly, we think she had a csf leak spontaneous due to heavy lifting, she went to [facility] and also has had blood patches. She has a minor (very minor she can't really even call it a headaches) pressure 20 minutes after she gets up but not significant and she is thrilled.  No masseters, no temples, no LS, no OO.   Reviewed orally with patient, additionally signature is on file:  Botulism toxin has been approved by the Federal drug administration for treatment of chronic migraine. Botulism toxin does not cure chronic migraine and it may not be effective in some patients.  The administration of botulism toxin is accomplished by injecting a small amount of toxin into the muscles of the neck and head. Dosage must be titrated for each individual. Any benefits resulting from botulism toxin tend to wear off after 3 months with a repeat injection required if benefit is to be maintained. Injections are usually done every 3-4 months with maximum effect peak achieved by about 2 or 3 weeks. Botulism toxin is expensive and you should be sure of what costs you will incur resulting from the injection.  The side effects of botulism toxin use for chronic migraine may include:   -Transient, and usually mild, facial weakness with facial injections  -Transient, and usually mild, head or neck weakness with head/neck injections  -Reduction or loss of forehead facial animation due to forehead muscle weakness  -Eyelid drooping  -Dry eye  -Pain at the site of injection or bruising at the site of injection  -Double  vision  -Potential unknown long term risks  Contraindications: You should not have Botox if you are pregnant, nursing, allergic to albumin, have an infection, skin condition, or muscle weakness at the site of the injection, or have myasthenia gravis, Lambert-Eaton syndrome, or ALS.  It is also possible that as with any injection, there may be an allergic reaction or no effect from the medication. Reduced effectiveness after repeated injections is sometimes seen and rarely infection at the injection site may occur. All care will be taken to prevent these side effects. If therapy is given over a long time, atrophy and wasting in the muscle injected may occur. Occasionally the patient's become refractory to treatment because they develop antibodies to the toxin. In this event, therapy needs to be modified.  I have read the above information and consent to the administration of botulism toxin.    PROCEDURE NOTE FOR MIGRAINE HEADACHE    Contraindications and precautions discussed with patient(above). Aseptic procedure was observed and patient tolerated procedure. Procedure performed by Dr. Georgia Dom  The condition has existed for more than 6 months, and pt does not have a diagnosis of ALS, Myasthenia Gravis or Lambert-Eaton Syndrome.  Risks and benefits of injections discussed and pt agrees to proceed with the procedure.  Written consent obtained  These injections are medically necessary. Pt  receives good benefits from these injections. These injections do not cause sedations or hallucinations which the oral therapies may cause.  Description of procedure:  The patient was placed in a sitting position. The standard protocol was used  for Botox as follows, with 5 units of Botox injected at each site:   -Procerus muscle, midline injection  -Corrugator muscle, bilateral injection  -Frontalis muscle, bilateral injection, with 2 sites each side, medial injection was performed in the upper one  third of the frontalis muscle, in the region vertical from the medial inferior edge of the superior orbital rim. The lateral injection was again in the upper one third of the forehead vertically above the lateral limbus of the cornea, 1.5 cm lateral to the medial injection site.  -Temporalis muscle injection, 5 sites, bilaterally. The first injection was 3 cm above the tragus of the ear, second injection site was 1.5 cm to 3 cm up from the first injection site in line with the tragus of the ear. The third injection site was 1.5-3 cm forward between the first 2 injection sites. The fourth injection site was 1.5 cm posterior to the second injection site.   -Occipitalis muscle injection, 3 sites, bilaterally. The first injection was done one half way between the occipital protuberance and the tip of the mastoid process behind the ear. The second injection site was done lateral and superior to the first, 1 fingerbreadth from the first injection. The third injection site was 1 fingerbreadth superiorly and medially from the first injection site.  -Cervical paraspinal muscle injection, 2 sites, bilateral knee first injection site was 1 cm from the midline of the cervical spine, 3 cm inferior to the lower border of the occipital protuberance. The second injection site was 1.5 cm superiorly and laterally to the first injection site.  -Trapezius muscle injection was performed at 3 sites, bilaterally. The first injection site was in the upper trapezius muscle halfway between the inflection point of the neck, and the acromion. The second injection site was one half way between the acromion and the first injection site. The third injection was done between the first injection site and the inflection point of the neck.   Will return for repeat injection in 3 months.   A 200 unit sof Botox was used, any Botox not injected was wasted. The patient tolerated the procedure well, there were no complications of the above  procedure.

## 2019-03-29 NOTE — Telephone Encounter (Signed)
I spent a combined 3 hours on the phone with the pharmacy. I was told on Wednesday 01/20 Thursday 01/21 and Friday 01/22 that it was being marked urgent and would be worked by the end of the day. When I checked today, nothing had been worked still.

## 2019-03-29 NOTE — Telephone Encounter (Signed)
I called the patient insurance company [name] at [phone] and spoke with representative Vicente Males D who stated that patients policy was active and current. The benefits are covered under "out of state benefits" on the patients plan and codes [phone] and [phone] are covered services with details listed below regarding benefits for the codes. [id number] J code needs auth at [phone].   Ded-750 OOP-6,500 Co-insurance-80 percent 50 dollar copay for [id number]   B/B [phone] (01/25-12/26/21)   Went ahead and changed to B/B since [facility] will no longer be supplying Botox. DWD

## 2019-03-29 NOTE — Progress Notes (Signed)
Botox- 200 units x 1 vial Lot: [id number] Expiration: 12/2021 NDC: 0023-3921-02  Bacteriostatic 0.9% Sodium Chloride- 4mL total Lot: [id number] Expiration: 06/03/2019 NDC: 0409-1966-02  Dx: [phone] B/B   

## 2019-05-20 ENCOUNTER — Encounter: Payer: Self-pay | Admitting: *Deleted

## 2019-05-20 ENCOUNTER — Telehealth: Payer: Self-pay | Admitting: *Deleted

## 2019-05-20 NOTE — Telephone Encounter (Signed)
Completed [employer] PA on Cover My Meds. Key: [id number]. Awaiting Ingenio determination. Pt made aware via mychart.

## 2019-05-24 ENCOUNTER — Encounter: Payer: Self-pay | Admitting: *Deleted

## 2019-05-24 NOTE — Telephone Encounter (Signed)
Per Cover My Meds, [employer] approved.   PA Case: [id number], Status: Approved, Coverage Starts on: 05/22/2019 12:00:00 AM, Coverage Ends on: 05/21/2020 12:00:00 AM.

## 2019-06-16 ENCOUNTER — Telehealth: Payer: Self-pay | Admitting: *Deleted

## 2019-06-16 NOTE — Telephone Encounter (Signed)
I called [employer] [phone] and spoke to [place].  She states that [id number] and [phone] are valid and billable.  [id number] will require PA. That is handled through Utilization Management [phone].  Ref# for this call is [id number]. I called Utilization Management and spoke [place] C to initiate PA.  States that a PA is already on file.  The case# is [id number] Valid from 03/29/2019-02/27/2020

## 2019-06-28 NOTE — Progress Notes (Signed)
Consent Form Botulism Toxin Injection For Chronic Migraine   03/29/2019: Last migraine was christmas day. Still great improvent. She continues to have possible low-pressure headaches. She recently had another blood patch which did not help.     Exceptional improvement, only had to take sumatriptan for one migraine in last 3 months. > 99% improvement in migraines. Her other headaches are improving as well slowly, we think she had a csf leak spontaneous due to heavy lifting, she went to [facility] and also has had blood patches. She has a minor (very minor she can't really even call it a headaches) pressure 20 minutes after she gets up but not significant and she is thrilled.  No masseters, no temples, no LS, no OO.   Reviewed orally with patient, additionally signature is on file:  Botulism toxin has been approved by the Federal drug administration for treatment of chronic migraine. Botulism toxin does not cure chronic migraine and it may not be effective in some patients.  The administration of botulism toxin is accomplished by injecting a small amount of toxin into the muscles of the neck and head. Dosage must be titrated for each individual. Any benefits resulting from botulism toxin tend to wear off after 3 months with a repeat injection required if benefit is to be maintained. Injections are usually done every 3-4 months with maximum effect peak achieved by about 2 or 3 weeks. Botulism toxin is expensive and you should be sure of what costs you will incur resulting from the injection.  The side effects of botulism toxin use for chronic migraine may include:   -Transient, and usually mild, facial weakness with facial injections  -Transient, and usually mild, head or neck weakness with head/neck injections  -Reduction or loss of forehead facial animation due to forehead muscle weakness  -Eyelid drooping  -Dry eye  -Pain at the site of injection or bruising at the site of injection  -Double  vision  -Potential unknown long term risks  Contraindications: You should not have Botox if you are pregnant, nursing, allergic to albumin, have an infection, skin condition, or muscle weakness at the site of the injection, or have myasthenia gravis, Lambert-Eaton syndrome, or ALS.  It is also possible that as with any injection, there may be an allergic reaction or no effect from the medication. Reduced effectiveness after repeated injections is sometimes seen and rarely infection at the injection site may occur. All care will be taken to prevent these side effects. If therapy is given over a long time, atrophy and wasting in the muscle injected may occur. Occasionally the patient's become refractory to treatment because they develop antibodies to the toxin. In this event, therapy needs to be modified.  I have read the above information and consent to the administration of botulism toxin.    PROCEDURE NOTE FOR MIGRAINE HEADACHE    Contraindications and precautions discussed with patient(above). Aseptic procedure was observed and patient tolerated procedure. Procedure performed by Dr. Georgia Dom  The condition has existed for more than 6 months, and pt does not have a diagnosis of ALS, Myasthenia Gravis or Lambert-Eaton Syndrome.  Risks and benefits of injections discussed and pt agrees to proceed with the procedure.  Written consent obtained  These injections are medically necessary. Pt  receives good benefits from these injections. These injections do not cause sedations or hallucinations which the oral therapies may cause.  Description of procedure:  The patient was placed in a sitting position. The standard protocol was used  for Botox as follows, with 5 units of Botox injected at each site:   -Procerus muscle, midline injection  -Corrugator muscle, bilateral injection  -Frontalis muscle, bilateral injection, with 2 sites each side, medial injection was performed in the upper one  third of the frontalis muscle, in the region vertical from the medial inferior edge of the superior orbital rim. The lateral injection was again in the upper one third of the forehead vertically above the lateral limbus of the cornea, 1.5 cm lateral to the medial injection site.  -Temporalis muscle injection, 5 sites, bilaterally. The first injection was 3 cm above the tragus of the ear, second injection site was 1.5 cm to 3 cm up from the first injection site in line with the tragus of the ear. The third injection site was 1.5-3 cm forward between the first 2 injection sites. The fourth injection site was 1.5 cm posterior to the second injection site.   -Occipitalis muscle injection, 3 sites, bilaterally. The first injection was done one half way between the occipital protuberance and the tip of the mastoid process behind the ear. The second injection site was done lateral and superior to the first, 1 fingerbreadth from the first injection. The third injection site was 1 fingerbreadth superiorly and medially from the first injection site.  -Cervical paraspinal muscle injection, 2 sites, bilateral knee first injection site was 1 cm from the midline of the cervical spine, 3 cm inferior to the lower border of the occipital protuberance. The second injection site was 1.5 cm superiorly and laterally to the first injection site.  -Trapezius muscle injection was performed at 3 sites, bilaterally. The first injection site was in the upper trapezius muscle halfway between the inflection point of the neck, and the acromion. The second injection site was one half way between the acromion and the first injection site. The third injection was done between the first injection site and the inflection point of the neck.   Will return for repeat injection in 3 months.   A 200 unit sof Botox was used, any Botox not injected was wasted. The patient tolerated the procedure well, there were no complications of the above  procedure.

## 2019-06-29 ENCOUNTER — Ambulatory Visit (INDEPENDENT_AMBULATORY_CARE_PROVIDER_SITE_OTHER): Payer: BC Managed Care – PPO | Admitting: Neurology

## 2019-06-29 VITALS — Temp 97.4°F

## 2019-06-29 DIAGNOSIS — G43711 Chronic migraine without aura, intractable, with status migrainosus: Secondary | ICD-10-CM | POA: Diagnosis not present

## 2019-06-29 MED ORDER — ONDANSETRON 4 MG PO TBDP: 4.0000 mg | ORAL_TABLET | Freq: Three times a day (TID) | ORAL | 11 refills | Status: DC | PRN

## 2019-06-29 NOTE — Progress Notes (Signed)
Botox- 200 units x 1 vial Lot: [id number] Expiration: 02/2022 NDC: [id number]  Bacteriostatic 0.9% Sodium Chloride- 36mL total Lot: [id number] Expiration: 09/02/2019 NDC: [id number]  Dx: [id number] B/B

## 2019-09-28 NOTE — Progress Notes (Signed)
Consent Form Botulism Toxin Injection For Chronic Migraine   09/29/2019: In April 2 migraines, May none, June 2 migraines. She loves nurtec. She may be having high-pressure headaches, worse laying down and sititng up helped. Will see if diamox may help. No masseters.  Meds ordered this encounter  Medications  . Rimegepant Sulfate (NURTEC) 75 MG TBDP    Sig: Take 75 mg by mouth every other day.    Dispense:  16 tablet    Refill:  6    Approved for prevention every other day dispense max allowed by insurance  . acetaZOLAMIDE (DIAMOX) 125 MG tablet    Sig: Take 1 tablet (125 mg total) by mouth 3 (three) times daily as needed.    Dispense:  90 tablet    Refill:  6    Exceptional improvement, only had to take sumatriptan for one migraine in last 3 months. > 99% improvement in migraines. Her other headaches are improving as well slowly, we think she had a csf leak spontaneous due to heavy lifting, she went to [facility] and also has had blood patches. She has a minor (very minor she can't really even call it a headaches) pressure 20 minutes after she gets up but not significant and she is thrilled.  No masseters, no temples, no LS, no OO.   Reviewed orally with patient, additionally signature is on file:  Botulism toxin has been approved by the Federal drug administration for treatment of chronic migraine. Botulism toxin does not cure chronic migraine and it may not be effective in some patients.  The administration of botulism toxin is accomplished by injecting a small amount of toxin into the muscles of the neck and head. Dosage must be titrated for each individual. Any benefits resulting from botulism toxin tend to wear off after 3 months with a repeat injection required if benefit is to be maintained. Injections are usually done every 3-4 months with maximum effect peak achieved by about 2 or 3 weeks. Botulism toxin is expensive and you should be sure of what costs you will incur resulting from  the injection.  The side effects of botulism toxin use for chronic migraine may include:   -Transient, and usually mild, facial weakness with facial injections  -Transient, and usually mild, head or neck weakness with head/neck injections  -Reduction or loss of forehead facial animation due to forehead muscle weakness  -Eyelid drooping  -Dry eye  -Pain at the site of injection or bruising at the site of injection  -Double vision  -Potential unknown long term risks  Contraindications: You should not have Botox if you are pregnant, nursing, allergic to albumin, have an infection, skin condition, or muscle weakness at the site of the injection, or have myasthenia gravis, Lambert-Eaton syndrome, or ALS.  It is also possible that as with any injection, there may be an allergic reaction or no effect from the medication. Reduced effectiveness after repeated injections is sometimes seen and rarely infection at the injection site may occur. All care will be taken to prevent these side effects. If therapy is given over a long time, atrophy and wasting in the muscle injected may occur. Occasionally the patient's become refractory to treatment because they develop antibodies to the toxin. In this event, therapy needs to be modified.  I have read the above information and consent to the administration of botulism toxin.    PROCEDURE NOTE FOR MIGRAINE HEADACHE    Contraindications and precautions discussed with patient(above). Aseptic procedure was observed  and patient tolerated procedure. Procedure performed by Dr. Georgia Dom  The condition has existed for more than 6 months, and pt does not have a diagnosis of ALS, Myasthenia Gravis or Lambert-Eaton Syndrome.  Risks and benefits of injections discussed and pt agrees to proceed with the procedure.  Written consent obtained  These injections are medically necessary. Pt  receives good benefits from these injections. These injections do not cause  sedations or hallucinations which the oral therapies may cause.  Description of procedure:  The patient was placed in a sitting position. The standard protocol was used for Botox as follows, with 5 units of Botox injected at each site:   -Procerus muscle, midline injection  -Corrugator muscle, bilateral injection  -Frontalis muscle, bilateral injection, with 2 sites each side, medial injection was performed in the upper one third of the frontalis muscle, in the region vertical from the medial inferior edge of the superior orbital rim. The lateral injection was again in the upper one third of the forehead vertically above the lateral limbus of the cornea, 1.5 cm lateral to the medial injection site.  -Temporalis muscle injection, 5 sites, bilaterally. The first injection was 3 cm above the tragus of the ear, second injection site was 1.5 cm to 3 cm up from the first injection site in line with the tragus of the ear. The third injection site was 1.5-3 cm forward between the first 2 injection sites. The fourth injection site was 1.5 cm posterior to the second injection site.   -Occipitalis muscle injection, 3 sites, bilaterally. The first injection was done one half way between the occipital protuberance and the tip of the mastoid process behind the ear. The second injection site was done lateral and superior to the first, 1 fingerbreadth from the first injection. The third injection site was 1 fingerbreadth superiorly and medially from the first injection site.  -Cervical paraspinal muscle injection, 2 sites, bilateral knee first injection site was 1 cm from the midline of the cervical spine, 3 cm inferior to the lower border of the occipital protuberance. The second injection site was 1.5 cm superiorly and laterally to the first injection site.  -Trapezius muscle injection was performed at 3 sites, bilaterally. The first injection site was in the upper trapezius muscle halfway between the  inflection point of the neck, and the acromion. The second injection site was one half way between the acromion and the first injection site. The third injection was done between the first injection site and the inflection point of the neck.   Will return for repeat injection in 3 months.   A 200 unit sof Botox was used, any Botox not injected was wasted. The patient tolerated the procedure well, there were no complications of the above procedure.

## 2019-09-29 ENCOUNTER — Ambulatory Visit: Payer: BC Managed Care – PPO | Admitting: Neurology

## 2019-09-29 DIAGNOSIS — G43711 Chronic migraine without aura, intractable, with status migrainosus: Secondary | ICD-10-CM

## 2019-09-29 MED ORDER — NURTEC 75 MG PO TBDP: 75.0000 mg | ORAL_TABLET | ORAL | 6 refills | Status: DC

## 2019-09-29 MED ORDER — ACETAZOLAMIDE 125 MG PO TABS: 125.0000 mg | ORAL_TABLET | Freq: Three times a day (TID) | ORAL | 6 refills | Status: DC | PRN

## 2019-09-29 NOTE — Progress Notes (Signed)
Botox- 200 units x 1 vial Lot: [id number] Expiration: 06/2022 NDC: 0023-3921-02  Bacteriostatic 0.9% Sodium Chloride- 4mL total Lot: [id number] Expiration: 12/02/2020 NDC: 0409-1966-02  Dx: G43.711 B/B  

## 2019-10-20 ENCOUNTER — Other Ambulatory Visit: Payer: Self-pay | Admitting: Neurology

## 2019-10-20 DIAGNOSIS — G43711 Chronic migraine without aura, intractable, with status migrainosus: Secondary | ICD-10-CM

## 2019-11-23 NOTE — Telephone Encounter (Signed)
Received Nurtec PA from [facility].  Called patient's pharmacy as listed on fax, was informed by pharmacist that patient had used her 24 tablets that insurance allowed in 76 day period.  Informed pharmacy patient's PA is authorized through 05/21/20.  He expressed appreciaiton for information.

## 2019-11-25 NOTE — Telephone Encounter (Signed)
Completed Nurtec PA request on Cover My Meds. Request for 16 tablets per 30 days. Key: [id number]. Awaiting [facility] determination.

## 2019-12-15 ENCOUNTER — Encounter: Payer: Self-pay | Admitting: *Deleted

## 2020-01-11 ENCOUNTER — Other Ambulatory Visit: Payer: Self-pay

## 2020-01-11 ENCOUNTER — Ambulatory Visit: Payer: BC Managed Care – PPO | Admitting: Neurology

## 2020-01-11 DIAGNOSIS — G43711 Chronic migraine without aura, intractable, with status migrainosus: Secondary | ICD-10-CM

## 2020-01-11 NOTE — Progress Notes (Signed)
Botox- 200 units x 1 vial Lot: [id number] Expiration: 08/2022 NDC: 1712-7871-83  Bacteriostatic 0.9% Sodium Chloride- 68mL total Lot: [id number] Expiration: 04/04/2021 NDC: 0164-2903-79  Dx: D58.316 B/B

## 2020-01-11 NOTE — Progress Notes (Signed)
Consent Form Botulism Toxin Injection For Chronic Migraine  01/11/2020: Diamox helped her positional headaches (worse laying down), she is using a wedge at bedtime to help keep her head elevated, I recommended she try diamox daily as its not really as needed medication. Also gave her some qulipta samples to try as needed. No masseters. Do the cervical paraspinals and traps, right trap I worse. +5u orbic oculi  09/29/2019: In April 2 migraines, May none, June 2 migraines. She loves nurtec. She may be having high-pressure headaches, worse laying down and sititng up helped. Will see if diamox may help. No masseters.  No orders of the defined types were placed in this encounter.   Exceptional improvement, only had to take sumatriptan for one migraine in last 3 months. > 99% improvement in migraines. Her other headaches are improving as well slowly, we think she had a csf leak spontaneous due to heavy lifting, she went to [facility] and also has had blood patches. She has a minor (very minor she can't really even call it a headaches) pressure 20 minutes after she gets up but not significant and she is thrilled.  No masseters, no temples, no LS, no OO.   Reviewed orally with patient, additionally signature is on file:  Botulism toxin has been approved by the Federal drug administration for treatment of chronic migraine. Botulism toxin does not cure chronic migraine and it may not be effective in some patients.  The administration of botulism toxin is accomplished by injecting a small amount of toxin into the muscles of the neck and head. Dosage must be titrated for each individual. Any benefits resulting from botulism toxin tend to wear off after 3 months with a repeat injection required if benefit is to be maintained. Injections are usually done every 3-4 months with maximum effect peak achieved by about 2 or 3 weeks. Botulism toxin is expensive and you should be sure of what costs you will incur resulting  from the injection.  The side effects of botulism toxin use for chronic migraine may include:   -Transient, and usually mild, facial weakness with facial injections  -Transient, and usually mild, head or neck weakness with head/neck injections  -Reduction or loss of forehead facial animation due to forehead muscle weakness  -Eyelid drooping  -Dry eye  -Pain at the site of injection or bruising at the site of injection  -Double vision  -Potential unknown long term risks  Contraindications: You should not have Botox if you are pregnant, nursing, allergic to albumin, have an infection, skin condition, or muscle weakness at the site of the injection, or have myasthenia gravis, Lambert-Eaton syndrome, or ALS.  It is also possible that as with any injection, there may be an allergic reaction or no effect from the medication. Reduced effectiveness after repeated injections is sometimes seen and rarely infection at the injection site may occur. All care will be taken to prevent these side effects. If therapy is given over a long time, atrophy and wasting in the muscle injected may occur. Occasionally the patient's become refractory to treatment because they develop antibodies to the toxin. In this event, therapy needs to be modified.  I have read the above information and consent to the administration of botulism toxin.    PROCEDURE NOTE FOR MIGRAINE HEADACHE    Contraindications and precautions discussed with patient(above). Aseptic procedure was observed and patient tolerated procedure. Procedure performed by Dr. Georgia Dom  The condition has existed for more than 6 months, and  pt does not have a diagnosis of ALS, Myasthenia Gravis or Lambert-Eaton Syndrome.  Risks and benefits of injections discussed and pt agrees to proceed with the procedure.  Written consent obtained  These injections are medically necessary. Pt  receives good benefits from these injections. These injections do not cause  sedations or hallucinations which the oral therapies may cause.  Description of procedure:  The patient was placed in a sitting position. The standard protocol was used for Botox as follows, with 5 units of Botox injected at each site:   -Procerus muscle, midline injection  -Corrugator muscle, bilateral injection  -Frontalis muscle, bilateral injection, with 2 sites each side, medial injection was performed in the upper one third of the frontalis muscle, in the region vertical from the medial inferior edge of the superior orbital rim. The lateral injection was again in the upper one third of the forehead vertically above the lateral limbus of the cornea, 1.5 cm lateral to the medial injection site.  -Temporalis muscle injection, 5 sites, bilaterally. The first injection was 3 cm above the tragus of the ear, second injection site was 1.5 cm to 3 cm up from the first injection site in line with the tragus of the ear. The third injection site was 1.5-3 cm forward between the first 2 injection sites. The fourth injection site was 1.5 cm posterior to the second injection site.   -Occipitalis muscle injection, 3 sites, bilaterally. The first injection was done one half way between the occipital protuberance and the tip of the mastoid process behind the ear. The second injection site was done lateral and superior to the first, 1 fingerbreadth from the first injection. The third injection site was 1 fingerbreadth superiorly and medially from the first injection site.  -Cervical paraspinal muscle injection, 2 sites, bilateral knee first injection site was 1 cm from the midline of the cervical spine, 3 cm inferior to the lower border of the occipital protuberance. The second injection site was 1.5 cm superiorly and laterally to the first injection site.  -Trapezius muscle injection was performed at 3 sites, bilaterally. The first injection site was in the upper trapezius muscle halfway between the  inflection point of the neck, and the acromion. The second injection site was one half way between the acromion and the first injection site. The third injection was done between the first injection site and the inflection point of the neck.   Will return for repeat injection in 3 months.   A 200 unit sof Botox was used, any Botox not injected was wasted. The patient tolerated the procedure well, there were no complications of the above procedure.

## 2020-02-10 ENCOUNTER — Telehealth: Payer: Self-pay | Admitting: Neurology

## 2020-02-10 NOTE — Telephone Encounter (Signed)
Patient has her next Botox appointment on 04/11/2020. She has a PA for Botox on file with [facility] that will expire on 12/26. I called [name] and spoke with Pearl. She states code [phone] will require PA, but [phone] will not. She advised me to call [phone] to speak with utilization management for PA. She states the specialty pharmacy patient can use is [facility] [phone]). Reference #[id number].

## 2020-02-15 ENCOUNTER — Other Ambulatory Visit: Payer: Self-pay | Admitting: Neurology

## 2020-04-05 NOTE — Telephone Encounter (Signed)
I called [name] and spoke with Lattie Haw to initiate PA. I gave clinicals over the phone and Lattie Haw was able to approve request. PA #[id number] (04/05/20- 04/04/21). Patient will be B/B.

## 2020-04-10 NOTE — Progress Notes (Signed)
Consent Form Botulism Toxin Injection For Chronic Migraine   04/11/2020: feels like she is haivng more headaches that sound high pressure. We can increase diamox see what happens.  Still doing extremely well, no migraines. 01/11/2020: Diamox helped her positional headaches (worse laying down), she is using a wedge at bedtime to help keep her head elevated, I recommended she try diamox daily as its not really as needed medication. Also gave her some qulipta samples to try as needed. No masseters. Do the cervical paraspinals and traps, right trap I worse. +5u orbic oculi  09/29/2019: In April 2 migraines, May none, June 2 migraines. She loves nurtec. She may be having high-pressure headaches, worse laying down and sititng up helped. Will see if diamox may help. No masseters.  No orders of the defined types were placed in this encounter.   Exceptional improvement, only had to take sumatriptan for one migraine in last 3 months. > 99% improvement in migraines. Her other headaches are improving as well slowly, we think she had a csf leak spontaneous due to heavy lifting, she went to [facility] and also has had blood patches. She has a minor (very minor she can't really even call it a headaches) pressure 20 minutes after she gets up but not significant and she is thrilled.  No masseters, no temples, no LS, no OO.   Reviewed orally with patient, additionally signature is on file:  Botulism toxin has been approved by the Federal drug administration for treatment of chronic migraine. Botulism toxin does not cure chronic migraine and it may not be effective in some patients.  The administration of botulism toxin is accomplished by injecting a small amount of toxin into the muscles of the neck and head. Dosage must be titrated for each individual. Any benefits resulting from botulism toxin tend to wear off after 3 months with a repeat injection required if benefit is to be maintained. Injections are usually done  every 3-4 months with maximum effect peak achieved by about 2 or 3 weeks. Botulism toxin is expensive and you should be sure of what costs you will incur resulting from the injection.  The side effects of botulism toxin use for chronic migraine may include:   -Transient, and usually mild, facial weakness with facial injections  -Transient, and usually mild, head or neck weakness with head/neck injections  -Reduction or loss of forehead facial animation due to forehead muscle weakness  -Eyelid drooping  -Dry eye  -Pain at the site of injection or bruising at the site of injection  -Double vision  -Potential unknown long term risks  Contraindications: You should not have Botox if you are pregnant, nursing, allergic to albumin, have an infection, skin condition, or muscle weakness at the site of the injection, or have myasthenia gravis, Lambert-Eaton syndrome, or ALS.  It is also possible that as with any injection, there may be an allergic reaction or no effect from the medication. Reduced effectiveness after repeated injections is sometimes seen and rarely infection at the injection site may occur. All care will be taken to prevent these side effects. If therapy is given over a long time, atrophy and wasting in the muscle injected may occur. Occasionally the patient's become refractory to treatment because they develop antibodies to the toxin. In this event, therapy needs to be modified.  I have read the above information and consent to the administration of botulism toxin.    PROCEDURE NOTE FOR MIGRAINE HEADACHE    Contraindications and precautions discussed  with patient(above). Aseptic procedure was observed and patient tolerated procedure. Procedure performed by Dr. Georgia Dom  The condition has existed for more than 6 months, and pt does not have a diagnosis of ALS, Myasthenia Gravis or Lambert-Eaton Syndrome.  Risks and benefits of injections discussed and pt agrees to proceed with the  procedure.  Written consent obtained  These injections are medically necessary. Pt  receives good benefits from these injections. These injections do not cause sedations or hallucinations which the oral therapies may cause.  Description of procedure:  The patient was placed in a sitting position. The standard protocol was used for Botox as follows, with 5 units of Botox injected at each site:   -Procerus muscle, midline injection  -Corrugator muscle, bilateral injection  -Frontalis muscle, bilateral injection, with 2 sites each side, medial injection was performed in the upper one third of the frontalis muscle, in the region vertical from the medial inferior edge of the superior orbital rim. The lateral injection was again in the upper one third of the forehead vertically above the lateral limbus of the cornea, 1.5 cm lateral to the medial injection site.  -Temporalis muscle injection, 5 sites, bilaterally. The first injection was 3 cm above the tragus of the ear, second injection site was 1.5 cm to 3 cm up from the first injection site in line with the tragus of the ear. The third injection site was 1.5-3 cm forward between the first 2 injection sites. The fourth injection site was 1.5 cm posterior to the second injection site.   -Occipitalis muscle injection, 3 sites, bilaterally. The first injection was done one half way between the occipital protuberance and the tip of the mastoid process behind the ear. The second injection site was done lateral and superior to the first, 1 fingerbreadth from the first injection. The third injection site was 1 fingerbreadth superiorly and medially from the first injection site.  -Cervical paraspinal muscle injection, 2 sites, bilateral knee first injection site was 1 cm from the midline of the cervical spine, 3 cm inferior to the lower border of the occipital protuberance. The second injection site was 1.5 cm superiorly and laterally to the first injection  site.  -Trapezius muscle injection was performed at 3 sites, bilaterally. The first injection site was in the upper trapezius muscle halfway between the inflection point of the neck, and the acromion. The second injection site was one half way between the acromion and the first injection site. The third injection was done between the first injection site and the inflection point of the neck.   Will return for repeat injection in 3 months.   A 200 unit sof Botox was used, any Botox not injected was wasted. The patient tolerated the procedure well, there were no complications of the above procedure.

## 2020-04-11 ENCOUNTER — Ambulatory Visit: Payer: BC Managed Care – PPO | Admitting: Neurology

## 2020-04-11 DIAGNOSIS — G43711 Chronic migraine without aura, intractable, with status migrainosus: Secondary | ICD-10-CM

## 2020-04-11 MED ORDER — AIMOVIG 140 MG/ML ~~LOC~~ SOAJ: 140.0000 mg | SUBCUTANEOUS | 4 refills | Status: DC

## 2020-04-11 MED ORDER — SUMATRIPTAN SUCCINATE 100 MG PO TABS: 100.0000 mg | ORAL_TABLET | Freq: Two times a day (BID) | ORAL | 12 refills | Status: DC | PRN

## 2020-04-11 MED ORDER — ONDANSETRON 4 MG PO TBDP: 4.0000 mg | ORAL_TABLET | Freq: Three times a day (TID) | ORAL | 11 refills | Status: AC | PRN

## 2020-04-11 MED ORDER — NURTEC 75 MG PO TBDP: 75.0000 mg | ORAL_TABLET | ORAL | 6 refills | Status: DC

## 2020-04-11 MED ORDER — ACETAZOLAMIDE 125 MG PO TABS: 125.0000 mg | ORAL_TABLET | Freq: Three times a day (TID) | ORAL | 6 refills | Status: DC | PRN

## 2020-04-11 NOTE — Progress Notes (Signed)
Botox- 200 units x 1 vial Lot: [id number] Expiration: 12/2022 NDC: 4536-4680-32  Bacteriostatic 0.9% Sodium Chloride- 35mL total Lot: [id number] Expiration: 04/04/2021 NDC: 5003-7048-88  Dx: B16.945 B/B

## 2020-04-12 ENCOUNTER — Other Ambulatory Visit: Payer: Self-pay | Admitting: *Deleted

## 2020-04-12 NOTE — Progress Notes (Signed)
Pharmacy was notified yesterday to d/c Aimovig.

## 2020-04-18 ENCOUNTER — Ambulatory Visit: Payer: Self-pay | Admitting: Neurology

## 2020-05-04 ENCOUNTER — Other Ambulatory Visit: Payer: Self-pay | Admitting: *Deleted

## 2020-05-04 DIAGNOSIS — G43711 Chronic migraine without aura, intractable, with status migrainosus: Secondary | ICD-10-CM

## 2020-05-04 MED ORDER — NURTEC 75 MG PO TBDP: ORAL_TABLET | ORAL | 6 refills | Status: DC

## 2020-05-04 NOTE — Telephone Encounter (Signed)
Spoke with Dr Jaynee Eagles. We are changing Nurtec to acute use instead of prevention.

## 2020-05-23 ENCOUNTER — Telehealth: Payer: Self-pay | Admitting: *Deleted

## 2020-05-23 NOTE — Telephone Encounter (Signed)
Completed Nurtec PA on Cover My Meds. Key: [id number]. Awaiting determination from [name].

## 2020-05-25 NOTE — Telephone Encounter (Signed)
Per Cover My Meds, Nurtec approved.   PA Case: [id number], Status: Approved, Coverage Starts on: 05/23/2020 12:00:00 AM, Coverage Ends on: 05/23/2021 12:00:00 AM.

## 2020-07-11 ENCOUNTER — Ambulatory Visit: Payer: BC Managed Care – PPO | Admitting: Neurology

## 2020-07-11 DIAGNOSIS — G43711 Chronic migraine without aura, intractable, with status migrainosus: Secondary | ICD-10-CM

## 2020-07-11 NOTE — Progress Notes (Signed)
Consent Form Botulism Toxin Injection For Chronic Migraine  07/11/2020: Stable  04/11/2020: feels like she is haivng more headaches that sound high pressure. We can increase diamox see what happens.  Still doing extremely well, no migraines. 01/11/2020: Diamox helped her positional headaches (worse laying down), she is using a wedge at bedtime to help keep her head elevated, I recommended she try diamox daily as its not really as needed medication. Also gave her some qulipta samples to try as needed. No masseters. Do the cervical paraspinals and traps, right trap I worse. +5u orbic oculi  09/29/2019: In April 2 migraines, May none, June 2 migraines. She loves nurtec. She may be having high-pressure headaches, worse laying down and sititng up helped. Will see if diamox may help. No masseters.  No orders of the defined types were placed in this encounter.   Exceptional improvement, only had to take sumatriptan for one migraine in last 3 months. > 99% improvement in migraines. Her other headaches are improving as well slowly, we think she had a csf leak spontaneous due to heavy lifting, she went to [facility] and also has had blood patches. She has a minor (very minor she can't really even call it a headaches) pressure 20 minutes after she gets up but not significant and she is thrilled.  No masseters, no temples, no LS, no OO.   Reviewed orally with patient, additionally signature is on file:  Botulism toxin has been approved by the Federal drug administration for treatment of chronic migraine. Botulism toxin does not cure chronic migraine and it may not be effective in some patients.  The administration of botulism toxin is accomplished by injecting a small amount of toxin into the muscles of the neck and head. Dosage must be titrated for each individual. Any benefits resulting from botulism toxin tend to wear off after 3 months with a repeat injection required if benefit is to be maintained. Injections  are usually done every 3-4 months with maximum effect peak achieved by about 2 or 3 weeks. Botulism toxin is expensive and you should be sure of what costs you will incur resulting from the injection.  The side effects of botulism toxin use for chronic migraine may include:   -Transient, and usually mild, facial weakness with facial injections  -Transient, and usually mild, head or neck weakness with head/neck injections  -Reduction or loss of forehead facial animation due to forehead muscle weakness  -Eyelid drooping  -Dry eye  -Pain at the site of injection or bruising at the site of injection  -Double vision  -Potential unknown long term risks  Contraindications: You should not have Botox if you are pregnant, nursing, allergic to albumin, have an infection, skin condition, or muscle weakness at the site of the injection, or have myasthenia gravis, Lambert-Eaton syndrome, or ALS.  It is also possible that as with any injection, there may be an allergic reaction or no effect from the medication. Reduced effectiveness after repeated injections is sometimes seen and rarely infection at the injection site may occur. All care will be taken to prevent these side effects. If therapy is given over a long time, atrophy and wasting in the muscle injected may occur. Occasionally the patient's become refractory to treatment because they develop antibodies to the toxin. In this event, therapy needs to be modified.  I have read the above information and consent to the administration of botulism toxin.    PROCEDURE NOTE FOR MIGRAINE HEADACHE    Contraindications and  precautions discussed with patient(above). Aseptic procedure was observed and patient tolerated procedure. Procedure performed by Dr. Georgia Dom  The condition has existed for more than 6 months, and pt does not have a diagnosis of ALS, Myasthenia Gravis or Lambert-Eaton Syndrome.  Risks and benefits of injections discussed and pt agrees to  proceed with the procedure.  Written consent obtained  These injections are medically necessary. Pt  receives good benefits from these injections. These injections do not cause sedations or hallucinations which the oral therapies may cause.  Description of procedure:  The patient was placed in a sitting position. The standard protocol was used for Botox as follows, with 5 units of Botox injected at each site:   -Procerus muscle, midline injection  -Corrugator muscle, bilateral injection  -Frontalis muscle, bilateral injection, with 2 sites each side, medial injection was performed in the upper one third of the frontalis muscle, in the region vertical from the medial inferior edge of the superior orbital rim. The lateral injection was again in the upper one third of the forehead vertically above the lateral limbus of the cornea, 1.5 cm lateral to the medial injection site.  -Temporalis muscle injection, 5 sites, bilaterally. The first injection was 3 cm above the tragus of the ear, second injection site was 1.5 cm to 3 cm up from the first injection site in line with the tragus of the ear. The third injection site was 1.5-3 cm forward between the first 2 injection sites. The fourth injection site was 1.5 cm posterior to the second injection site.   -Occipitalis muscle injection, 3 sites, bilaterally. The first injection was done one half way between the occipital protuberance and the tip of the mastoid process behind the ear. The second injection site was done lateral and superior to the first, 1 fingerbreadth from the first injection. The third injection site was 1 fingerbreadth superiorly and medially from the first injection site.  -Cervical paraspinal muscle injection, 2 sites, bilateral knee first injection site was 1 cm from the midline of the cervical spine, 3 cm inferior to the lower border of the occipital protuberance. The second injection site was 1.5 cm superiorly and laterally to the  first injection site.  -Trapezius muscle injection was performed at 3 sites, bilaterally. The first injection site was in the upper trapezius muscle halfway between the inflection point of the neck, and the acromion. The second injection site was one half way between the acromion and the first injection site. The third injection was done between the first injection site and the inflection point of the neck.   Will return for repeat injection in 3 months.   A 200 unit sof Botox was used, any Botox not injected was wasted. The patient tolerated the procedure well, there were no complications of the above procedure.

## 2020-07-11 NOTE — Progress Notes (Signed)
Botox- 100 units x 2 vials Lot: [id number] Expiration: 07/2022 NDC: 0023-1145-01  Bacteriostatic 0.9% Sodium Chloride- 4mL total Lot: [id number] Expiration: 08/02/2021 NDC: 0409-1966-02  Dx: G43.711 B/B   

## 2020-09-13 ENCOUNTER — Ambulatory Visit (HOSPITAL_COMMUNITY): Payer: Self-pay | Admitting: Clinical

## 2020-09-13 ENCOUNTER — Other Ambulatory Visit: Payer: Self-pay

## 2020-10-17 ENCOUNTER — Ambulatory Visit: Payer: BC Managed Care – PPO | Admitting: Neurology

## 2020-10-17 ENCOUNTER — Other Ambulatory Visit: Payer: Self-pay

## 2020-10-17 DIAGNOSIS — G43711 Chronic migraine without aura, intractable, with status migrainosus: Secondary | ICD-10-CM

## 2020-10-17 NOTE — Progress Notes (Signed)
Consent Form Botulism Toxin Injection For Chronic Migraine    10/17/2020:stable, still doing well. No masseters. Do the cervical paraspinals and traps, right trap I worse. +5u orbic oculi  07/11/2020: Stable  04/11/2020: feels like she is haivng more headaches that sound high pressure. We can increase diamox see what happens.  Still doing extremely well, no migraines. 01/11/2020: Diamox helped her positional headaches (worse laying down), she is using a wedge at bedtime to help keep her head elevated, I recommended she try diamox daily as its not really as needed medication. Also gave her some qulipta samples to try as needed. No masseters. Do the cervical paraspinals and traps, right trap I worse. +5u orbic oculi  09/29/2019: In April 2 migraines, May none, June 2 migraines. She loves nurtec. She may be having high-pressure headaches, worse laying down and sititng up helped. Will see if diamox may help. No masseters.  No orders of the defined types were placed in this encounter.   Exceptional improvement, only had to take sumatriptan for one migraine in last 3 months. > 99% improvement in migraines. Her other headaches are improving as well slowly, we think she had a csf leak spontaneous due to heavy lifting, she went to [facility] and also has had blood patches. She has a minor (very minor she can't really even call it a headaches) pressure 20 minutes after she gets up but not significant and she is thrilled.  No masseters, no temples, no LS, no OO.   Reviewed orally with patient, additionally signature is on file:  Botulism toxin has been approved by the Federal drug administration for treatment of chronic migraine. Botulism toxin does not cure chronic migraine and it may not be effective in some patients.  The administration of botulism toxin is accomplished by injecting a small amount of toxin into the muscles of the neck and head. Dosage must be titrated for each individual. Any benefits  resulting from botulism toxin tend to wear off after 3 months with a repeat injection required if benefit is to be maintained. Injections are usually done every 3-4 months with maximum effect peak achieved by about 2 or 3 weeks. Botulism toxin is expensive and you should be sure of what costs you will incur resulting from the injection.  The side effects of botulism toxin use for chronic migraine may include:   -Transient, and usually mild, facial weakness with facial injections  -Transient, and usually mild, head or neck weakness with head/neck injections  -Reduction or loss of forehead facial animation due to forehead muscle weakness  -Eyelid drooping  -Dry eye  -Pain at the site of injection or bruising at the site of injection  -Double vision  -Potential unknown long term risks  Contraindications: You should not have Botox if you are pregnant, nursing, allergic to albumin, have an infection, skin condition, or muscle weakness at the site of the injection, or have myasthenia gravis, Lambert-Eaton syndrome, or ALS.  It is also possible that as with any injection, there may be an allergic reaction or no effect from the medication. Reduced effectiveness after repeated injections is sometimes seen and rarely infection at the injection site may occur. All care will be taken to prevent these side effects. If therapy is given over a long time, atrophy and wasting in the muscle injected may occur. Occasionally the patient's become refractory to treatment because they develop antibodies to the toxin. In this event, therapy needs to be modified.  I have read the above  information and consent to the administration of botulism toxin.    PROCEDURE NOTE FOR MIGRAINE HEADACHE    Contraindications and precautions discussed with patient(above). Aseptic procedure was observed and patient tolerated procedure. Procedure performed by Dr. Georgia Dom  The condition has existed for more than 6 months, and pt  does not have a diagnosis of ALS, Myasthenia Gravis or Lambert-Eaton Syndrome.  Risks and benefits of injections discussed and pt agrees to proceed with the procedure.  Written consent obtained  These injections are medically necessary. Pt  receives good benefits from these injections. These injections do not cause sedations or hallucinations which the oral therapies may cause.  Description of procedure:  The patient was placed in a sitting position. The standard protocol was used for Botox as follows, with 5 units of Botox injected at each site:   -Procerus muscle, midline injection  -Corrugator muscle, bilateral injection  -Frontalis muscle, bilateral injection, with 2 sites each side, medial injection was performed in the upper one third of the frontalis muscle, in the region vertical from the medial inferior edge of the superior orbital rim. The lateral injection was again in the upper one third of the forehead vertically above the lateral limbus of the cornea, 1.5 cm lateral to the medial injection site.  -Temporalis muscle injection, 5 sites, bilaterally. The first injection was 3 cm above the tragus of the ear, second injection site was 1.5 cm to 3 cm up from the first injection site in line with the tragus of the ear. The third injection site was 1.5-3 cm forward between the first 2 injection sites. The fourth injection site was 1.5 cm posterior to the second injection site.   -Occipitalis muscle injection, 3 sites, bilaterally. The first injection was done one half way between the occipital protuberance and the tip of the mastoid process behind the ear. The second injection site was done lateral and superior to the first, 1 fingerbreadth from the first injection. The third injection site was 1 fingerbreadth superiorly and medially from the first injection site.  -Cervical paraspinal muscle injection, 2 sites, bilateral knee first injection site was 1 cm from the midline of the cervical  spine, 3 cm inferior to the lower border of the occipital protuberance. The second injection site was 1.5 cm superiorly and laterally to the first injection site.  -Trapezius muscle injection was performed at 3 sites, bilaterally. The first injection site was in the upper trapezius muscle halfway between the inflection point of the neck, and the acromion. The second injection site was one half way between the acromion and the first injection site. The third injection was done between the first injection site and the inflection point of the neck.   Will return for repeat injection in 3 months.   A 200 unit sof Botox was used, any Botox not injected was wasted. The patient tolerated the procedure well, there were no complications of the above procedure.

## 2020-10-17 NOTE — Progress Notes (Signed)
Botox- 200 units x 1 vial Lot: [id number] Expiration: 03/2023 NDC: [id number]   Bacteriostatic 0.9% Sodium Chloride- 86m total Lot: [id number]: 03/04/2022 NDC: [id number]  Dx: [id number]

## 2020-12-28 ENCOUNTER — Ambulatory Visit: Payer: BC Managed Care – PPO | Admitting: Neurology

## 2020-12-28 DIAGNOSIS — G43711 Chronic migraine without aura, intractable, with status migrainosus: Secondary | ICD-10-CM

## 2020-12-28 NOTE — Progress Notes (Signed)
Botox- 200 units x 1 vial Lot: [id number] Expiration: 05/2021 NDC: 0459-9774-14  Bacteriostatic 0.9% Sodium Chloride- 49mL total Lot: [id number] Expiration: 03/04/2022 NDC: 0233-4356-86  Dx: H68.372 Sample

## 2020-12-28 NOTE — Progress Notes (Signed)
Consent Form Botulism Toxin Injection For Chronic Migraine  I spent 30 minutes of face-to-face and non-face-to-face time with patient on the  1. Chronic migraine without aura, with intractable migraine, so stated, with status migrainosus    diagnosis.  This included previsit chart review, lab review, study review, order entry, electronic health record documentation, patient education on the different diagnostic and therapeutic options, counseling and coordination of care, risks and benefits of management, compliance, or risk factor reduction     12/31/2020:stable, still doing well.>80% improvement in headache and migraine frequency. She does not clench. Do the cervical paraspinals and traps, right trap I worse. Nurtec works great for her. 4 migraines a month resolved with nurtec. 1-2 headaches a month. Not taking diamox.  Reviewed orally with patient, additionally signature is on file:  Botulism toxin has been approved by the Federal drug administration for treatment of chronic migraine. Botulism toxin does not cure chronic migraine and it may not be effective in some patients.  The administration of botulism toxin is accomplished by injecting a small amount of toxin into the muscles of the neck and head. Dosage must be titrated for each individual. Any benefits resulting from botulism toxin tend to wear off after 3 months with a repeat injection required if benefit is to be maintained. Injections are usually done every 3-4 months with maximum effect peak achieved by about 2 or 3 weeks. Botulism toxin is expensive and you should be sure of what costs you will incur resulting from the injection.  The side effects of botulism toxin use for chronic migraine may include:   -Transient, and usually mild, facial weakness with facial injections  -Transient, and usually mild, head or neck weakness with head/neck injections  -Reduction or loss of forehead facial animation due to forehead muscle  weakness  -Eyelid drooping  -Dry eye  -Pain at the site of injection or bruising at the site of injection  -Double vision  -Potential unknown long term risks  Contraindications: You should not have Botox if you are pregnant, nursing, allergic to albumin, have an infection, skin condition, or muscle weakness at the site of the injection, or have myasthenia gravis, Lambert-Eaton syndrome, or ALS.  It is also possible that as with any injection, there may be an allergic reaction or no effect from the medication. Reduced effectiveness after repeated injections is sometimes seen and rarely infection at the injection site may occur. All care will be taken to prevent these side effects. If therapy is given over a long time, atrophy and wasting in the muscle injected may occur. Occasionally the patient's become refractory to treatment because they develop antibodies to the toxin. In this event, therapy needs to be modified.  I have read the above information and consent to the administration of botulism toxin.    PROCEDURE NOTE FOR MIGRAINE HEADACHE    Contraindications and precautions discussed with patient(above). Aseptic procedure was observed and patient tolerated procedure. Procedure performed by Dr. Georgia Dom  The condition has existed for more than 6 months, and pt does not have a diagnosis of ALS, Myasthenia Gravis or Lambert-Eaton Syndrome.  Risks and benefits of injections discussed and pt agrees to proceed with the procedure.  Written consent obtained  These injections are medically necessary. Pt  receives good benefits from these injections. These injections do not cause sedations or hallucinations which the oral therapies may cause.  Description of procedure:  The patient was placed in a sitting position. The standard protocol was used  for Botox as follows, with 5 units of Botox injected at each site:   -Procerus muscle, midline injection  -Corrugator muscle, bilateral  injection  -Frontalis muscle, bilateral injection, with 2 sites each side, medial injection was performed in the upper one third of the frontalis muscle, in the region vertical from the medial inferior edge of the superior orbital rim. The lateral injection was again in the upper one third of the forehead vertically above the lateral limbus of the cornea, 1.5 cm lateral to the medial injection site.  -Temporalis muscle injection, 5 sites, bilaterally. The first injection was 3 cm above the tragus of the ear, second injection site was 1.5 cm to 3 cm up from the first injection site in line with the tragus of the ear. The third injection site was 1.5-3 cm forward between the first 2 injection sites. The fourth injection site was 1.5 cm posterior to the second injection site.   -Occipitalis muscle injection, 3 sites, bilaterally. The first injection was done one half way between the occipital protuberance and the tip of the mastoid process behind the ear. The second injection site was done lateral and superior to the first, 1 fingerbreadth from the first injection. The third injection site was 1 fingerbreadth superiorly and medially from the first injection site.  -Cervical paraspinal muscle injection, 2 sites, bilateral knee first injection site was 1 cm from the midline of the cervical spine, 3 cm inferior to the lower border of the occipital protuberance. The second injection site was 1.5 cm superiorly and laterally to the first injection site.  -Trapezius muscle injection was performed at 3 sites, bilaterally. The first injection site was in the upper trapezius muscle halfway between the inflection point of the neck, and the acromion. The second injection site was one half way between the acromion and the first injection site. The third injection was done between the first injection site and the inflection point of the neck.   Will return for repeat injection in 3 months.   A 155 unit sof Botox was  used, 45u Botox not injected was wasted. The patient tolerated the procedure well, there were no complications of the above procedure.

## 2021-01-23 ENCOUNTER — Ambulatory Visit: Payer: BC Managed Care – PPO | Admitting: Neurology

## 2021-02-06 ENCOUNTER — Encounter: Payer: Self-pay | Admitting: Neurology

## 2021-02-27 ENCOUNTER — Ambulatory Visit: Payer: BC Managed Care – PPO | Admitting: Neurology

## 2021-03-02 ENCOUNTER — Encounter: Payer: Self-pay | Admitting: Neurology

## 2021-03-13 ENCOUNTER — Ambulatory Visit: Payer: BC Managed Care – PPO | Admitting: Neurology

## 2021-03-13 ENCOUNTER — Other Ambulatory Visit: Payer: Self-pay

## 2021-03-13 DIAGNOSIS — G43711 Chronic migraine without aura, intractable, with status migrainosus: Secondary | ICD-10-CM | POA: Diagnosis not present

## 2021-03-13 NOTE — Progress Notes (Signed)
Consent Form Botulism Toxin Injection For Chronic Migraine  03/13/2021: She had to increase her estrogen had hot flashes. Otherwise doing great.   12/31/2020:stable, still doing well.>80% improvement in headache and migraine frequency. She does not clench. Do the cervical paraspinals and traps, right trap I worse. Nurtec works great for her. 4 migraines a month resolved with nurtec. 1-2 headaches a month. Not taking diamox.  Reviewed orally with patient, additionally signature is on file:  Botulism toxin has been approved by the Federal drug administration for treatment of chronic migraine. Botulism toxin does not cure chronic migraine and it may not be effective in some patients.  The administration of botulism toxin is accomplished by injecting a small amount of toxin into the muscles of the neck and head. Dosage must be titrated for each individual. Any benefits resulting from botulism toxin tend to wear off after 3 months with a repeat injection required if benefit is to be maintained. Injections are usually done every 3-4 months with maximum effect peak achieved by about 2 or 3 weeks. Botulism toxin is expensive and you should be sure of what costs you will incur resulting from the injection.  The side effects of botulism toxin use for chronic migraine may include:   -Transient, and usually mild, facial weakness with facial injections  -Transient, and usually mild, head or neck weakness with head/neck injections  -Reduction or loss of forehead facial animation due to forehead muscle weakness  -Eyelid drooping  -Dry eye  -Pain at the site of injection or bruising at the site of injection  -Double vision  -Potential unknown long term risks  Contraindications: You should not have Botox if you are pregnant, nursing, allergic to albumin, have an infection, skin condition, or muscle weakness at the site of the injection, or have myasthenia gravis, Lambert-Eaton syndrome, or ALS.  It is  also possible that as with any injection, there may be an allergic reaction or no effect from the medication. Reduced effectiveness after repeated injections is sometimes seen and rarely infection at the injection site may occur. All care will be taken to prevent these side effects. If therapy is given over a long time, atrophy and wasting in the muscle injected may occur. Occasionally the patient's become refractory to treatment because they develop antibodies to the toxin. In this event, therapy needs to be modified.  I have read the above information and consent to the administration of botulism toxin.    PROCEDURE NOTE FOR MIGRAINE HEADACHE    Contraindications and precautions discussed with patient(above). Aseptic procedure was observed and patient tolerated procedure. Procedure performed by Dr. Georgia Dom  The condition has existed for more than 6 months, and pt does not have a diagnosis of ALS, Myasthenia Gravis or Lambert-Eaton Syndrome.  Risks and benefits of injections discussed and pt agrees to proceed with the procedure.  Written consent obtained  These injections are medically necessary. Pt  receives good benefits from these injections. These injections do not cause sedations or hallucinations which the oral therapies may cause.  Description of procedure:  The patient was placed in a sitting position. The standard protocol was used for Botox as follows, with 5 units of Botox injected at each site:   -Procerus muscle, midline injection  -Corrugator muscle, bilateral injection  -Frontalis muscle, bilateral injection, with 2 sites each side, medial injection was performed in the upper one third of the frontalis muscle, in the region vertical from the medial inferior edge of the superior orbital  rim. The lateral injection was again in the upper one third of the forehead vertically above the lateral limbus of the cornea, 1.5 cm lateral to the medial injection site.  -Temporalis  muscle injection, 5 sites, bilaterally. The first injection was 3 cm above the tragus of the ear, second injection site was 1.5 cm to 3 cm up from the first injection site in line with the tragus of the ear. The third injection site was 1.5-3 cm forward between the first 2 injection sites. The fourth injection site was 1.5 cm posterior to the second injection site.   -Occipitalis muscle injection, 3 sites, bilaterally. The first injection was done one half way between the occipital protuberance and the tip of the mastoid process behind the ear. The second injection site was done lateral and superior to the first, 1 fingerbreadth from the first injection. The third injection site was 1 fingerbreadth superiorly and medially from the first injection site.  -Cervical paraspinal muscle injection, 2 sites, bilateral knee first injection site was 1 cm from the midline of the cervical spine, 3 cm inferior to the lower border of the occipital protuberance. The second injection site was 1.5 cm superiorly and laterally to the first injection site.  -Trapezius muscle injection was performed at 3 sites, bilaterally. The first injection site was in the upper trapezius muscle halfway between the inflection point of the neck, and the acromion. The second injection site was one half way between the acromion and the first injection site. The third injection was done between the first injection site and the inflection point of the neck.   Will return for repeat injection in 3 months.   A 155 unit sof Botox was used, 45u Botox not injected was wasted. The patient tolerated the procedure well, there were no complications of the above procedure.

## 2021-03-13 NOTE — Progress Notes (Signed)
Botox- 200 units x 1 vial Lot: [id number] Expiration: 11/2023 NDC: 0658-2608-88  Bacteriostatic 0.9% Sodium Chloride- 85mL total Lot: [id number] Expiration: 10/03/2022 NDC: 5207-6191-55  Dx: K27.142 B/B

## 2021-04-05 ENCOUNTER — Encounter: Payer: Self-pay | Admitting: Neurology

## 2021-04-05 DIAGNOSIS — G43711 Chronic migraine without aura, intractable, with status migrainosus: Secondary | ICD-10-CM

## 2021-04-10 MED ORDER — NURTEC 75 MG PO TBDP: ORAL_TABLET | ORAL | 6 refills | Status: DC

## 2021-05-16 NOTE — Telephone Encounter (Signed)
Received another Nurtec PA on Cover My Meds. Sent to [employer]. Awaiting determination. Key: [id number].   ?

## 2021-05-17 NOTE — Telephone Encounter (Signed)
Approved on March 15 ?PA Case: [id number], Status: Approved, Coverage Starts on: 05/16/2021 12:00:00 AM, Coverage Ends on: 05/16/2022 12:00:00 AM.  ? ?Faxed approval letter to [place]. Received a receipt of confirmation. ? ?

## 2021-05-22 ENCOUNTER — Telehealth: Payer: Self-pay | Admitting: Neurology

## 2021-05-22 NOTE — Telephone Encounter (Signed)
Faxed signed PA form with OV notes to [employer]. ?

## 2021-05-22 NOTE — Telephone Encounter (Signed)
Completed [employer] Botox continuation form, placed in Nurse Pod for MD signature. ?

## 2021-06-05 ENCOUNTER — Telehealth: Payer: Self-pay | Admitting: Neurology

## 2021-06-05 NOTE — Telephone Encounter (Signed)
Lm to get appt rescheduled, due to provider being out of the office. ?

## 2021-06-12 ENCOUNTER — Ambulatory Visit: Payer: BC Managed Care – PPO | Admitting: Neurology

## 2021-06-22 ENCOUNTER — Encounter: Payer: Self-pay | Admitting: Neurology

## 2021-07-15 ENCOUNTER — Encounter: Payer: Self-pay | Admitting: Neurology

## 2021-07-16 NOTE — Telephone Encounter (Signed)
Form printed and ready for Dr Cathren Laine signature.  ?

## 2021-08-27 ENCOUNTER — Encounter: Payer: Self-pay | Admitting: Neurology

## 2021-09-12 ENCOUNTER — Other Ambulatory Visit: Payer: Self-pay | Admitting: Dentistry

## 2021-09-12 DIAGNOSIS — M2669 Other specified disorders of temporomandibular joint: Secondary | ICD-10-CM

## 2021-10-03 ENCOUNTER — Ambulatory Visit
Admission: RE | Admit: 2021-10-03 | Discharge: 2021-10-03 | Disposition: A | Discharge status: Home / Self Care | Payer: BC Managed Care – PPO | Source: Ambulatory Visit | Attending: Dentistry | Admitting: Dentistry

## 2021-10-03 DIAGNOSIS — M2669 Other specified disorders of temporomandibular joint: Secondary | ICD-10-CM

## 2021-10-15 ENCOUNTER — Telehealth: Payer: Self-pay | Admitting: *Deleted

## 2021-10-15 NOTE — Telephone Encounter (Signed)
Attempted PA on CMM for BOTOX .KEY [id number] her Pharmacy benefits for SP).   ? Information regarding your request.  Medical Requests are not supported for Pharmacy benefit members.  Will call her insurance [phone] provider service at 0900.  I called and spoke to [place] # [id number], referred to utilization management [phone]. [id number] PA needed, [id number] no PA needed.  Spoke to [place] Case # [id number] [phone] referred to [facility] approval given 10-16-2021 thru 10-17-2022 for every 3 months 200units.  Should receive fax today (attempt x2 if not received will mail).  I spoke to pt and she will come in tomorrow at 1430 for her botox. Was appreciative.

## 2021-10-16 ENCOUNTER — Encounter: Payer: Self-pay | Admitting: Adult Health

## 2021-10-16 ENCOUNTER — Ambulatory Visit: Payer: BC Managed Care – PPO | Admitting: Adult Health

## 2021-10-16 ENCOUNTER — Ambulatory Visit: Payer: BC Managed Care – PPO | Admitting: Neurology

## 2021-10-16 VITALS — BP 115/58 | HR 92

## 2021-10-16 DIAGNOSIS — G43711 Chronic migraine without aura, intractable, with status migrainosus: Secondary | ICD-10-CM

## 2021-10-16 MED ORDER — ONABOTULINUMTOXINA 100 UNITS IJ SOLR
155.0000 [IU] | Freq: Once | INTRAMUSCULAR | Status: AC
  Administered 2021-10-16: 155 [IU] via INTRAMUSCULAR

## 2021-10-16 NOTE — Progress Notes (Signed)
Botox- 100 units x 2 vial Lot: [id number] Expiration: 11/25 NDC: 9458-5929-24   Bacteriostatic 0.9% Sodium Chloride- 38m total Lot: [id number]: 03 Nov 2022 NDC: [address]-79  Dx: GU38.333B/B

## 2021-10-16 NOTE — Progress Notes (Signed)
Consent Form Botulism Toxin Injection For Chronic Migraine      Update 10/16/2021 JM: Last Botox injection 7 months ago (was rescheduled and had difficulty getting rescheduled).  She has been having a gradual increase in headaches especially over the past few months.  Typically will use Nurtec with resolution but has more recently not been as effective.  Tolerated procedure well today.    03/13/2021 Dr. Jaynee Eagles: She had to increase her estrogen had hot flashes. Otherwise doing great.   12/31/2020 Dr. Jaynee Eagles :stable, still doing well.>80% improvement in headache and migraine frequency. She does not clench. Do the cervical paraspinals and traps, right trap I worse. Nurtec works great for her. 4 migraines a month resolved with nurtec. 1-2 headaches a month. Not taking diamox.  Reviewed orally with patient, additionally signature is on file:  Botulism toxin has been approved by the Federal drug administration for treatment of chronic migraine. Botulism toxin does not cure chronic migraine and it may not be effective in some patients.  The administration of botulism toxin is accomplished by injecting a small amount of toxin into the muscles of the neck and head. Dosage must be titrated for each individual. Any benefits resulting from botulism toxin tend to wear off after 3 months with a repeat injection required if benefit is to be maintained. Injections are usually done every 3-4 months with maximum effect peak achieved by about 2 or 3 weeks. Botulism toxin is expensive and you should be sure of what costs you will incur resulting from the injection.  The side effects of botulism toxin use for chronic migraine may include:   -Transient, and usually mild, facial weakness with facial injections  -Transient, and usually mild, head or neck weakness with head/neck injections  -Reduction or loss of forehead facial animation due to forehead muscle weakness  -Eyelid drooping  -Dry eye  -Pain at the  site of injection or bruising at the site of injection  -Double vision  -Potential unknown long term risks  Contraindications: You should not have Botox if you are pregnant, nursing, allergic to albumin, have an infection, skin condition, or muscle weakness at the site of the injection, or have myasthenia gravis, Lambert-Eaton syndrome, or ALS.  It is also possible that as with any injection, there may be an allergic reaction or no effect from the medication. Reduced effectiveness after repeated injections is sometimes seen and rarely infection at the injection site may occur. All care will be taken to prevent these side effects. If therapy is given over a long time, atrophy and wasting in the muscle injected may occur. Occasionally the patient's become refractory to treatment because they develop antibodies to the toxin. In this event, therapy needs to be modified.  I have read the above information and consent to the administration of botulism toxin.    PROCEDURE NOTE FOR MIGRAINE HEADACHE    Contraindications and precautions discussed with patient(above). Aseptic procedure was observed and patient tolerated procedure. Procedure performed by Frann Rider, AGNP-BC  The condition has existed for more than 6 months, and pt does not have a diagnosis of ALS, Myasthenia Gravis or Lambert-Eaton Syndrome.  Risks and benefits of injections discussed and pt agrees to proceed with the procedure.  Written consent obtained  These injections are medically necessary. Pt  receives good benefits from these injections. These injections do not cause sedations or hallucinations which the oral therapies may cause.  Description of procedure:  The patient was placed in a sitting position. The  standard protocol was used for Botox as follows, with 5 units of Botox injected at each site:   -Procerus muscle, midline injection  -Corrugator muscle, bilateral injection  -Frontalis muscle, bilateral injection,  with 2 sites each side, medial injection was performed in the upper one third of the frontalis muscle, in the region vertical from the medial inferior edge of the superior orbital rim. The lateral injection was again in the upper one third of the forehead vertically above the lateral limbus of the cornea, 1.5 cm lateral to the medial injection site.  -Temporalis muscle injection, 5 sites, bilaterally. The first injection was 3 cm above the tragus of the ear, second injection site was 1.5 cm to 3 cm up from the first injection site in line with the tragus of the ear. The third injection site was 1.5-3 cm forward between the first 2 injection sites. The fourth injection site was 1.5 cm posterior to the second injection site.   -Occipitalis muscle injection, 3 sites, bilaterally. The first injection was done one half way between the occipital protuberance and the tip of the mastoid process behind the ear. The second injection site was done lateral and superior to the first, 1 fingerbreadth from the first injection. The third injection site was 1 fingerbreadth superiorly and medially from the first injection site.  -Cervical paraspinal muscle injection, 2 sites, bilateral knee first injection site was 1 cm from the midline of the cervical spine, 3 cm inferior to the lower border of the occipital protuberance. The second injection site was 1.5 cm superiorly and laterally to the first injection site.  -Trapezius muscle injection was performed at 3 sites, bilaterally. The first injection site was in the upper trapezius muscle halfway between the inflection point of the neck, and the acromion. The second injection site was one half way between the acromion and the first injection site. The third injection was done between the first injection site and the inflection point of the neck.   Will return for repeat injection in 3 months.   A 155 unit sof Botox was used, 45u Botox not injected was wasted. The patient  tolerated the procedure well, there were no complications of the above procedure.

## 2022-01-10 ENCOUNTER — Ambulatory Visit: Payer: BC Managed Care – PPO | Admitting: Neurology

## 2022-01-10 DIAGNOSIS — G43711 Chronic migraine without aura, intractable, with status migrainosus: Secondary | ICD-10-CM

## 2022-01-10 DIAGNOSIS — G43009 Migraine without aura, not intractable, without status migrainosus: Secondary | ICD-10-CM

## 2022-01-10 DIAGNOSIS — G43709 Chronic migraine without aura, not intractable, without status migrainosus: Secondary | ICD-10-CM

## 2022-01-10 MED ORDER — NURTEC 75 MG PO TBDP: ORAL_TABLET | ORAL | 11 refills | Status: DC

## 2022-01-10 MED ORDER — ONABOTULINUMTOXINA 200 UNITS IJ SOLR
155.0000 [IU] | Freq: Once | INTRAMUSCULAR | Status: AC
  Administered 2022-01-10: 155 [IU] via INTRAMUSCULAR

## 2022-01-10 NOTE — Progress Notes (Signed)
Consent Form Botulism Toxin Injection For Chronic Migraine  01/10/2022 doing great. Only having 6 migraines a month on botox and < 12 total headache days a month. >>60% improvement in migraine frequency and severity on botox.   Meds ordered this encounter  Medications   botulinum toxin Type A (BOTOX) injection 155 Units    Botox- 200 units x 1 vial Lot: [id number] Expiration: 04/2024 NDC: 7622-6333-54  Bacteriostatic 0.9% Sodium Chloride- 75m total Lot: [id number]: 11/03/2022 NDC: [address]-76 Dx: GO11.572  B/B   Rimegepant Sulfate (NURTEC) 75 MG TBDP    Sig: Take 75 mg by mouth at onset of migraine once daily as needed. No more than 1 tablet per day.    Dispense:  16 tablet    Refill:  11    Please enroll patient in nurtec onesource program. Only having 6 migraines a month and < 12 total headache days a month. Failed imitrex, maxalt, relpax, zomig     Update 10/16/2021 JM: Last Botox injection 7 months ago (was rescheduled and had difficulty getting rescheduled).  She has been having a gradual increase in headaches especially over the past few months.  Typically will use Nurtec with resolution but has more recently not been as effective.  Tolerated procedure well today.    03/13/2021 Dr. AJaynee Eagles She had to increase her estrogen had hot flashes. Otherwise doing great.   12/31/2020 Dr. AJaynee Eagles:stable, still doing well.>80% improvement in headache and migraine frequency. She does not clench. Do the cervical paraspinals and traps, right trap I worse. Nurtec works great for her. 4 migraines a month resolved with nurtec. 1-2 headaches a month. Not taking diamox.  Reviewed orally with patient, additionally signature is on file:  Botulism toxin has been approved by the Federal drug administration for treatment of chronic migraine. Botulism toxin does not cure chronic migraine and it may not be effective in some patients.  The administration of botulism toxin is accomplished by  injecting a small amount of toxin into the muscles of the neck and head. Dosage must be titrated for each individual. Any benefits resulting from botulism toxin tend to wear off after 3 months with a repeat injection required if benefit is to be maintained. Injections are usually done every 3-4 months with maximum effect peak achieved by about 2 or 3 weeks. Botulism toxin is expensive and you should be sure of what costs you will incur resulting from the injection.  The side effects of botulism toxin use for chronic migraine may include:   -Transient, and usually mild, facial weakness with facial injections  -Transient, and usually mild, head or neck weakness with head/neck injections  -Reduction or loss of forehead facial animation due to forehead muscle weakness  -Eyelid drooping  -Dry eye  -Pain at the site of injection or bruising at the site of injection  -Double vision  -Potential unknown long term risks  Contraindications: You should not have Botox if you are pregnant, nursing, allergic to albumin, have an infection, skin condition, or muscle weakness at the site of the injection, or have myasthenia gravis, Lambert-Eaton syndrome, or ALS.  It is also possible that as with any injection, there may be an allergic reaction or no effect from the medication. Reduced effectiveness after repeated injections is sometimes seen and rarely infection at the injection site may occur. All care will be taken to prevent these side effects. If therapy is given over a long time, atrophy and wasting in the muscle injected may  occur. Occasionally the patient's become refractory to treatment because they develop antibodies to the toxin. In this event, therapy needs to be modified.  I have read the above information and consent to the administration of botulism toxin.    PROCEDURE NOTE FOR MIGRAINE HEADACHE    Contraindications and precautions discussed with patient(above). Aseptic procedure was observed  and patient tolerated procedure. Procedure performed by Frann Rider, AGNP-BC  The condition has existed for more than 6 months, and pt does not have a diagnosis of ALS, Myasthenia Gravis or Lambert-Eaton Syndrome.  Risks and benefits of injections discussed and pt agrees to proceed with the procedure.  Written consent obtained  These injections are medically necessary. Pt  receives good benefits from these injections. These injections do not cause sedations or hallucinations which the oral therapies may cause.  Description of procedure:  The patient was placed in a sitting position. The standard protocol was used for Botox as follows, with 5 units of Botox injected at each site:   -Procerus muscle, midline injection  -Corrugator muscle, bilateral injection  -Frontalis muscle, bilateral injection, with 2 sites each side, medial injection was performed in the upper one third of the frontalis muscle, in the region vertical from the medial inferior edge of the superior orbital rim. The lateral injection was again in the upper one third of the forehead vertically above the lateral limbus of the cornea, 1.5 cm lateral to the medial injection site.  -Temporalis muscle injection, 5 sites, bilaterally. The first injection was 3 cm above the tragus of the ear, second injection site was 1.5 cm to 3 cm up from the first injection site in line with the tragus of the ear. The third injection site was 1.5-3 cm forward between the first 2 injection sites. The fourth injection site was 1.5 cm posterior to the second injection site.   -Occipitalis muscle injection, 3 sites, bilaterally. The first injection was done one half way between the occipital protuberance and the tip of the mastoid process behind the ear. The second injection site was done lateral and superior to the first, 1 fingerbreadth from the first injection. The third injection site was 1 fingerbreadth superiorly and medially from the first  injection site.  -Cervical paraspinal muscle injection, 2 sites, bilateral knee first injection site was 1 cm from the midline of the cervical spine, 3 cm inferior to the lower border of the occipital protuberance. The second injection site was 1.5 cm superiorly and laterally to the first injection site.  -Trapezius muscle injection was performed at 3 sites, bilaterally. The first injection site was in the upper trapezius muscle halfway between the inflection point of the neck, and the acromion. The second injection site was one half way between the acromion and the first injection site. The third injection was done between the first injection site and the inflection point of the neck.   Will return for repeat injection in 3 months.   A 155 unit sof Botox was used, 45u Botox not injected was wasted. The patient tolerated the procedure well, there were no complications of the above procedure.

## 2022-01-10 NOTE — Progress Notes (Signed)
Botox- 200 units x 1 vial Lot: [id number] Expiration: 04/2024 NDC: 2224-1146-43  Bacteriostatic 0.9% Sodium Chloride- 3m total Lot: [id number]: 11/03/2022 NDC: [address]-61 Dx: GT64.353  B/B

## 2022-03-06 ENCOUNTER — Encounter: Payer: Self-pay | Admitting: Neurology

## 2022-03-06 DIAGNOSIS — G43711 Chronic migraine without aura, intractable, with status migrainosus: Secondary | ICD-10-CM

## 2022-03-06 DIAGNOSIS — G43009 Migraine without aura, not intractable, without status migrainosus: Secondary | ICD-10-CM

## 2022-03-06 NOTE — Telephone Encounter (Signed)
Pt has new [employer]. Needs auth for Botox. Next appt is on 04/04/22   Chronic Migraine CPT 64615  Botox J0585 Units: 200  G43.711 Chronic Migraine without aura, intractable, with status migrainous

## 2022-03-07 ENCOUNTER — Telehealth: Payer: Self-pay

## 2022-03-07 ENCOUNTER — Other Ambulatory Visit (HOSPITAL_COMMUNITY): Payer: Self-pay

## 2022-03-07 NOTE — Telephone Encounter (Signed)
Benefit Verification [id number] Submitted!

## 2022-03-08 ENCOUNTER — Other Ambulatory Visit (HOSPITAL_COMMUNITY): Payer: Self-pay

## 2022-03-11 MED ORDER — NURTEC 75 MG PO TBDP: 75.0000 mg | ORAL_TABLET | ORAL | 11 refills | Status: DC

## 2022-03-11 NOTE — Addendum Note (Signed)
Addended by: Gildardo Griffes on: 03/11/2022 02:46 PM   Modules accepted: Orders

## 2022-03-13 NOTE — Telephone Encounter (Signed)
Pharmacy Patient Advocate Encounter   Received notification from [facility] that prior authorization for Botox 200UNIT solution is required/requested.    PA submitted on 03/13/2022 to (ins) [employer]  via [employer] [id number] Status is pending

## 2022-03-18 NOTE — Telephone Encounter (Signed)
Tried to call [employer] for PA. Office closed.

## 2022-03-18 NOTE — Telephone Encounter (Signed)
Late entry from 03/14/22 around 6 pm: I spent an extended time on the phone trying to reach someone at [facility] (called the number on the back of pt's insurance card). I was transferred to another dept which ended up being [facility]. I was told they do not handle the PAs and I needed to call the number on the back of the pt's card. Will try again when office reopens.

## 2022-03-20 ENCOUNTER — Other Ambulatory Visit: Payer: Self-pay | Admitting: *Deleted

## 2022-03-20 NOTE — Telephone Encounter (Signed)
I called [facility] and reported the Nurtec prevention approval. They are unable to determine how quick they can fill it as they just provided patient with the acute dose prescription on 03/07/22. They are going to call the pt's insurance and then they will f/u with the patient. I have updated the patient.

## 2022-03-20 NOTE — Telephone Encounter (Addendum)
I was able to speak with Jenna Fitzgerald @ [facility] [phone] and completed Nurtec 75 mg PA over the phone for prevention #16/30. PA approved immediately from 03/20/22 - 03/20/2023 PA # [id number].

## 2022-03-29 ENCOUNTER — Encounter: Payer: Self-pay | Admitting: Gastroenterology

## 2022-04-01 ENCOUNTER — Telehealth: Payer: Self-pay | Admitting: *Deleted

## 2022-04-01 ENCOUNTER — Encounter: Payer: Self-pay | Admitting: Gastroenterology

## 2022-04-01 ENCOUNTER — Ambulatory Visit (AMBULATORY_SURGERY_CENTER): Payer: BC Managed Care – PPO

## 2022-04-01 VITALS — Ht 63.0 in | Wt 120.0 lb

## 2022-04-01 DIAGNOSIS — Z1211 Encounter for screening for malignant neoplasm of colon: Secondary | ICD-10-CM

## 2022-04-01 MED ORDER — NA SULFATE-K SULFATE-MG SULF 17.5-3.13-1.6 GM/177ML PO SOLN: 1.0000 | Freq: Once | ORAL | 0 refills | Status: DC

## 2022-04-01 MED ORDER — SUTAB 1479-225-188 MG PO TABS: 24.0000 | ORAL_TABLET | Freq: Once | ORAL | 0 refills | Status: AC

## 2022-04-01 NOTE — Telephone Encounter (Signed)
Sent  SUTAB rx to patient's requested pharmacy and sent new instructions through mychart.

## 2022-04-01 NOTE — Progress Notes (Signed)
Pre visit completed via phone call; Patient verified name, DOB, and address;  No egg or soy allergy known to patient  No issues known to pt with past sedation with any surgeries or procedures----other than PONV Patient denies ever being told they had issues or difficulty with intubation----other than PONV  No FH of Malignant Hyperthermia Pt is not on diet pills Pt is not on home 02  Pt is not on blood thinners  Pt reports issues with constipation -patient reports she "may go to the bathroom 2 or 3 times per week";- reports her stools are " peanut-butter-consistency, cigar shaped, and having a bowel movement does not cause any pain"; No A fib or A flutter Have any cardiac testing pending--NO Pt instructed to use [email or web address] or GoodRx for a price reduction on prep   Insurance verified during [place] appt=[name]  Patient's chart reviewed by Osvaldo Angst CNRA prior to previsit and patient appropriate for the [facility].  Previsit completed and red dot placed by patient's name on their procedure day (on provider's schedule).    Instructions sent via MyChart per patient's request;

## 2022-04-03 ENCOUNTER — Other Ambulatory Visit (HOSPITAL_COMMUNITY): Payer: Self-pay

## 2022-04-03 NOTE — Telephone Encounter (Signed)
I called insurance to follow up-sent fax with clinical notes to [phone] requested expedited PA.

## 2022-04-04 ENCOUNTER — Ambulatory Visit: Payer: BLUE CROSS/BLUE SHIELD | Admitting: Neurology

## 2022-04-04 ENCOUNTER — Other Ambulatory Visit (HOSPITAL_COMMUNITY): Payer: Self-pay

## 2022-04-04 DIAGNOSIS — G43711 Chronic migraine without aura, intractable, with status migrainosus: Secondary | ICD-10-CM | POA: Diagnosis not present

## 2022-04-04 MED ORDER — ONABOTULINUMTOXINA 200 UNITS IJ SOLR
155.0000 [IU] | Freq: Once | INTRAMUSCULAR | Status: AC
  Administered 2022-04-04: 155 [IU] via INTRAMUSCULAR

## 2022-04-04 NOTE — Progress Notes (Signed)
Consent Form Botulism Toxin Injection For Chronic Migraine 04/04/2022 stable 01/10/2022 doing great. Only having 6 migraines a month on botox and < 12 total headache days a month. >>60% improvement in migraine frequency and severity on botox.   Meds ordered this encounter  Medications   botulinum toxin Type A (BOTOX) injection 155 Units    Botox- 200 units x 1 vial Lot: [id number] Expiration: 08/2024 NDC: 9371-6967-89  Bacteriostatic 0.9% Sodium Chloride- 23m total Lot: [id number]: 11/25 NDC: 610258-527-78 Dx: GE42.353B/B     Update 10/16/2021 JM: Last Botox injection 7 months ago (was rescheduled and had difficulty getting rescheduled).  She has been having a gradual increase in headaches especially over the past few months.  Typically will use Nurtec with resolution but has more recently not been as effective.  Tolerated procedure well today.    03/13/2021 Dr. AJaynee Eagles She had to increase her estrogen had hot flashes. Otherwise doing great.   12/31/2020 Dr. AJaynee Eagles:stable, still doing well.>80% improvement in headache and migraine frequency. She does not clench. Do the cervical paraspinals and traps, right trap I worse. Nurtec works great for her. 4 migraines a month resolved with nurtec. 1-2 headaches a month. Not taking diamox.  Reviewed orally with patient, additionally signature is on file:  Botulism toxin has been approved by the Federal drug administration for treatment of chronic migraine. Botulism toxin does not cure chronic migraine and it may not be effective in some patients.  The administration of botulism toxin is accomplished by injecting a small amount of toxin into the muscles of the neck and head. Dosage must be titrated for each individual. Any benefits resulting from botulism toxin tend to wear off after 3 months with a repeat injection required if benefit is to be maintained. Injections are usually done every 3-4 months with maximum effect peak achieved by  about 2 or 3 weeks. Botulism toxin is expensive and you should be sure of what costs you will incur resulting from the injection.  The side effects of botulism toxin use for chronic migraine may include:   -Transient, and usually mild, facial weakness with facial injections  -Transient, and usually mild, head or neck weakness with head/neck injections  -Reduction or loss of forehead facial animation due to forehead muscle weakness  -Eyelid drooping  -Dry eye  -Pain at the site of injection or bruising at the site of injection  -Double vision  -Potential unknown long term risks  Contraindications: You should not have Botox if you are pregnant, nursing, allergic to albumin, have an infection, skin condition, or muscle weakness at the site of the injection, or have myasthenia gravis, Lambert-Eaton syndrome, or ALS.  It is also possible that as with any injection, there may be an allergic reaction or no effect from the medication. Reduced effectiveness after repeated injections is sometimes seen and rarely infection at the injection site may occur. All care will be taken to prevent these side effects. If therapy is given over a long time, atrophy and wasting in the muscle injected may occur. Occasionally the patient's become refractory to treatment because they develop antibodies to the toxin. In this event, therapy needs to be modified.  I have read the above information and consent to the administration of botulism toxin.    PROCEDURE NOTE FOR MIGRAINE HEADACHE    Contraindications and precautions discussed with patient(above). Aseptic procedure was observed and patient tolerated procedure. Procedure performed by JFrann Rider AGNP-BC  The condition has existed for  more than 6 months, and pt does not have a diagnosis of ALS, Myasthenia Gravis or Lambert-Eaton Syndrome.  Risks and benefits of injections discussed and pt agrees to proceed with the procedure.  Written consent obtained  These  injections are medically necessary. Pt  receives good benefits from these injections. These injections do not cause sedations or hallucinations which the oral therapies may cause.  Description of procedure:  The patient was placed in a sitting position. The standard protocol was used for Botox as follows, with 5 units of Botox injected at each site:   -Procerus muscle, midline injection  -Corrugator muscle, bilateral injection  -Frontalis muscle, bilateral injection, with 2 sites each side, medial injection was performed in the upper one third of the frontalis muscle, in the region vertical from the medial inferior edge of the superior orbital rim. The lateral injection was again in the upper one third of the forehead vertically above the lateral limbus of the cornea, 1.5 cm lateral to the medial injection site.  -Temporalis muscle injection, 5 sites, bilaterally. The first injection was 3 cm above the tragus of the ear, second injection site was 1.5 cm to 3 cm up from the first injection site in line with the tragus of the ear. The third injection site was 1.5-3 cm forward between the first 2 injection sites. The fourth injection site was 1.5 cm posterior to the second injection site.   -Occipitalis muscle injection, 3 sites, bilaterally. The first injection was done one half way between the occipital protuberance and the tip of the mastoid process behind the ear. The second injection site was done lateral and superior to the first, 1 fingerbreadth from the first injection. The third injection site was 1 fingerbreadth superiorly and medially from the first injection site.  -Cervical paraspinal muscle injection, 2 sites, bilateral knee first injection site was 1 cm from the midline of the cervical spine, 3 cm inferior to the lower border of the occipital protuberance. The second injection site was 1.5 cm superiorly and laterally to the first injection site.  -Trapezius muscle injection was  performed at 3 sites, bilaterally. The first injection site was in the upper trapezius muscle halfway between the inflection point of the neck, and the acromion. The second injection site was one half way between the acromion and the first injection site. The third injection was done between the first injection site and the inflection point of the neck.   Will return for repeat injection in 3 months.   A 155 unit sof Botox was used, 45u Botox not injected was wasted. The patient tolerated the procedure well, there were no complications of the above procedure.

## 2022-04-04 NOTE — Progress Notes (Signed)
Botox- 200 units x 1 vial Lot: [id number] Expiration: 08/2024 NDC: 6378-5885-02  Bacteriostatic 0.9% Sodium Chloride- 72m total Lot: [id number]: 11/25 NDC: 686767-209-47 Dx: GS96.283B/B  Botox consent signed

## 2022-04-04 NOTE — Telephone Encounter (Signed)
Received this from patient:      Reference # [id number] Approved 04/04/22 - 04/03/23

## 2022-04-16 ENCOUNTER — Encounter: Payer: BC Managed Care – PPO | Admitting: Gastroenterology

## 2022-04-19 ENCOUNTER — Encounter: Payer: Self-pay | Admitting: Gastroenterology

## 2022-05-01 ENCOUNTER — Encounter: Payer: Self-pay | Admitting: Certified Registered Nurse Anesthetist

## 2022-05-02 ENCOUNTER — Encounter: Payer: Self-pay | Admitting: Gastroenterology

## 2022-05-02 ENCOUNTER — Ambulatory Visit: Payer: BLUE CROSS/BLUE SHIELD | Admitting: Gastroenterology

## 2022-05-02 VITALS — BP 114/63 | HR 72 | Temp 97.8°F | Resp 15 | Ht 63.0 in | Wt 120.0 lb

## 2022-05-02 DIAGNOSIS — K635 Polyp of colon: Secondary | ICD-10-CM | POA: Diagnosis not present

## 2022-05-02 DIAGNOSIS — D123 Benign neoplasm of transverse colon: Secondary | ICD-10-CM | POA: Diagnosis not present

## 2022-05-02 DIAGNOSIS — Z1211 Encounter for screening for malignant neoplasm of colon: Secondary | ICD-10-CM | POA: Diagnosis present

## 2022-05-02 MED ORDER — SODIUM CHLORIDE 0.9 % IV SOLN: 500.0000 mL | INTRAVENOUS | Status: DC

## 2022-05-02 NOTE — Progress Notes (Signed)
Called to room to assist during endoscopic procedure.  Patient ID and intended procedure confirmed with present staff. Received instructions for my participation in the procedure from the performing physician.  

## 2022-05-02 NOTE — Patient Instructions (Signed)
Please read handouts provided. Continue present medications. Await pathology results. Repeat colonoscopy in 3 years for screening based on pathology results.   YOU HAD AN ENDOSCOPIC PROCEDURE TODAY AT [facility]:   Refer to the procedure report that was given to you for any specific questions about what was found during the examination.  If the procedure report does not answer your questions, please call your gastroenterologist to clarify.  If you requested that your care partner not be given the details of your procedure findings, then the procedure report has been included in a sealed envelope for you to review at your convenience later.  YOU SHOULD EXPECT: Some feelings of bloating in the abdomen. Passage of more gas than usual.  Walking can help get rid of the air that was put into your GI tract during the procedure and reduce the bloating. If you had a lower endoscopy (such as a colonoscopy or flexible sigmoidoscopy) you may notice spotting of blood in your stool or on the toilet paper. If you underwent a bowel prep for your procedure, you may not have a normal bowel movement for a few days.  Please Note:  You might notice some irritation and congestion in your nose or some drainage.  This is from the oxygen used during your procedure.  There is no need for concern and it should clear up in a day or so.  SYMPTOMS TO REPORT IMMEDIATELY:  Following lower endoscopy (colonoscopy or flexible sigmoidoscopy):  Excessive amounts of blood in the stool  Significant tenderness or worsening of abdominal pains  Swelling of the abdomen that is new, acute  Fever of 100F or higher  For urgent or emergent issues, a gastroenterologist can be reached at any hour by calling [phone]. Do not use MyChart messaging for urgent concerns.    DIET:  We do recommend a small meal at first, but then you may proceed to your regular diet.  Drink plenty of fluids but you should avoid alcoholic  beverages for 24 hours.  ACTIVITY:  You should plan to take it easy for the rest of today and you should NOT DRIVE or use heavy machinery until tomorrow (because of the sedation medicines used during the test).    FOLLOW UP: Our staff will call the number listed on your records the next business day following your procedure.  We will call around 7:15- 8:00 am to check on you and address any questions or concerns that you may have regarding the information given to you following your procedure. If we do not reach you, we will leave a message.     If any biopsies were taken you will be contacted by phone or by letter within the next 1-3 weeks.  Please call us at [phone] if you have not heard about the biopsies in 3 weeks.    SIGNATURES/CONFIDENTIALITY: You and/or your care partner have signed paperwork which will be entered into your electronic medical record.  These signatures attest to the fact that that the information above on your After Visit Summary has been reviewed and is understood.  Full responsibility of the confidentiality of this discharge information lies with you and/or your care-partner.

## 2022-05-02 NOTE — Op Note (Signed)
[facility] Patient Name: Jenna Fitzgerald Procedure Date: 05/02/2022 11:14 AM MRN: [id number] Endoscopist: Mauri Pole , MD, [id number] Age: 52 Referring MD:  Date of Birth: Nov 14, 1970 Gender: Female Account #: [id number] Procedure:                Colonoscopy Indications:              Screening for colorectal malignant neoplasm Medicines:                Monitored Anesthesia Care Procedure:                Pre-Anesthesia Assessment:                           - Prior to the procedure, a History and Physical                            was performed, and patient medications and                            allergies were reviewed. The patient's tolerance of                            previous anesthesia was also reviewed. The risks                            and benefits of the procedure and the sedation                            options and risks were discussed with the patient.                            All questions were answered, and informed consent                            was obtained. Prior Anticoagulants: The patient has                            taken no anticoagulant or antiplatelet agents. ASA                            Grade Assessment: II - A patient with mild systemic                            disease. After reviewing the risks and benefits,                            the patient was deemed in satisfactory condition to                            undergo the procedure.                           After obtaining informed consent, the colonoscope  was passed under direct vision. Throughout the                            procedure, the patient's blood pressure, pulse, and                            oxygen saturations were monitored continuously. The                            Olympus [id number]-[id number]) Colonoscope was                            introduced through the anus and advanced to the the                            cecum,  identified by appendiceal orifice and                            ileocecal valve. The colonoscopy was performed                            without difficulty. The patient tolerated the                            procedure well. The quality of the bowel                            preparation was adequate. The ileocecal valve,                            appendiceal orifice, and rectum were photographed. Scope In: 11:28:08 AM Scope Out: 11:42:05 AM Scope Withdrawal Time: 0 hours 8 minutes 8 seconds  Total Procedure Duration: 0 hours 13 minutes 57 seconds  Findings:                 The perianal and digital rectal examinations were                            normal.                           A 10 mm polyp was found in the transverse colon.                            The polyp was granular lateral spreading. The polyp                            was removed with a cold snare. Resection and                            retrieval were complete.                           Non-bleeding internal hemorrhoids were found during  retroflexion. The hemorrhoids were small. Complications:            No immediate complications. Estimated Blood Loss:     Estimated blood loss was minimal. Impression:               - One 10 mm polyp in the transverse colon, removed                            with a cold snare. Resected and retrieved.                           - Non-bleeding internal hemorrhoids. Recommendation:           - Patient has a contact number available for                            emergencies. The signs and symptoms of potential                            delayed complications were discussed with the                            patient. Return to normal activities tomorrow.                            Written discharge instructions were provided to the                            patient.                           - Resume previous diet.                           - Continue  present medications.                           - Await pathology results.                           - Repeat colonoscopy in 3 years for surveillance                            based on pathology results. Mauri Pole, MD 05/02/2022 11:46:19 AM This report has been signed electronically.

## 2022-05-02 NOTE — Progress Notes (Signed)
[facility] History and Physical   Primary Care Physician:  Yvone Neu, MD   Reason for Procedure:  Colorectal cancer screening  Plan:    Screening colonoscopy with possible interventions as needed     HPI: Jenna Fitzgerald is a very pleasant 52 y.o. female here for screening colonoscopy. Denies any nausea, vomiting, abdominal pain, melena or bright red blood per rectum  The risks and benefits as well as alternatives of endoscopic procedure(s) have been discussed and reviewed. All questions answered. The patient agrees to proceed.    Past Medical History:  Diagnosis Date   Cervical syndrome    Cervicocranial syndrome    Chronic mixed headache syndrome    Gastroenteritis    Hay fever    Irregular periods    Menopausal syndrome    Neck pain    Premenstrual tension syndrome    Refractory migraine    Seasonal allergies    Trigeminal neuralgia     Past Surgical History:  Procedure Laterality Date   BLOOD PATCH  2019   2020   LAPAROSCOPIC TOTAL HYSTERECTOMY  09/09/2016   SYMPATHECTOMY Bilateral 2004   WISDOM TOOTH EXTRACTION  1989    Prior to Admission medications   Medication Sig Start Date End Date Taking? Authorizing Provider  ALPRAZolam Duanne Moron) 0.5 MG tablet Take 0.5 mg by mouth at bedtime.   Yes [provider]  Biotin 1 MG CAPS Take 1 capsule by mouth daily at 6 (six) AM.   Yes [provider]  Doxylamine Succinate, Sleep, (SLEEP AID PO) Take 1 tablet by mouth at bedtime. OTC   Yes [provider]  escitalopram (LEXAPRO) 10 MG tablet Take 10 mg by mouth daily.   Yes [provider]  estradiol (VIVELLE-DOT) 0.1 MG/24HR patch Place onto the skin 2 (two) times a week. Apply and remove 1 patch transdermal twice weekly 03/19/17  Yes [provider]  Multiple Vitamin (MULTIVITAMIN) capsule Take 1 capsule by mouth daily.   Yes [provider]  Rimegepant Sulfate (NURTEC) 75 MG TBDP Take 75 mg by  mouth every other day. 03/11/22  Yes Melvenia Beam, MD  BOTOX 200 units SOLR ADMINISTER IN PHYSICIAN'S OFICE. 12/04/16   Melvenia Beam, MD  chlorpheniramine-HYDROcodone (TUSSIONEX) 10-8 MG/5ML Take 5 mLs by mouth every 12 (twelve) hours as needed for cough. 03/14/22   [provider]  estradiol (ESTRACE) 0.5 MG tablet Take 0.5 mg by mouth as needed. 03/07/21   [provider]  fluconazole (DIFLUCAN) 150 MG tablet Take 150 mg by mouth as needed. 03/14/22   [provider]  Loratadine 10 MG CAPS Take 10 mg by mouth as needed.    [provider]  OnabotulinumtoxinA (BOTOX IJ) Inject 1 Dose as directed every 3 (three) months.    [provider]  ondansetron (ZOFRAN-ODT) 4 MG disintegrating tablet Take 1-2 tablets (4-8 mg total) by mouth every 8 (eight) hours as needed for nausea. Can take with nurtec at onset of migraine. 04/11/20   Melvenia Beam, MD  SUMAtriptan (IMITREX) 100 MG tablet Take 1 tablet (100 mg total) by mouth 2 (two) times daily as needed. 04/11/20   Melvenia Beam, MD  tiZANidine (ZANAFLEX) 4 MG tablet Take 4 mg by mouth every 8 (eight) hours as needed for muscle spasms. 10/08/21   [provider]    Current Outpatient Medications  Medication Sig Dispense Refill   ALPRAZolam (XANAX) 0.5 MG tablet Take 0.5 mg by mouth at bedtime.  Biotin 1 MG CAPS Take 1 capsule by mouth daily at 6 (six) AM.     Doxylamine Succinate, Sleep, (SLEEP AID PO) Take 1 tablet by mouth at bedtime. OTC     escitalopram (LEXAPRO) 10 MG tablet Take 10 mg by mouth daily.     estradiol (VIVELLE-DOT) 0.1 MG/24HR patch Place onto the skin 2 (two) times a week. Apply and remove 1 patch transdermal twice weekly  12   Multiple Vitamin (MULTIVITAMIN) capsule Take 1 capsule by mouth daily.     Rimegepant Sulfate (NURTEC) 75 MG TBDP Take 75 mg by mouth every other day. 16 tablet 11   BOTOX 200 units SOLR ADMINISTER IN PHYSICIAN'S OFICE. 1 each 2    chlorpheniramine-HYDROcodone (TUSSIONEX) 10-8 MG/5ML Take 5 mLs by mouth every 12 (twelve) hours as needed for cough.     estradiol (ESTRACE) 0.5 MG tablet Take 0.5 mg by mouth as needed.     fluconazole (DIFLUCAN) 150 MG tablet Take 150 mg by mouth as needed.     Loratadine 10 MG CAPS Take 10 mg by mouth as needed.     OnabotulinumtoxinA (BOTOX IJ) Inject 1 Dose as directed every 3 (three) months.     ondansetron (ZOFRAN-ODT) 4 MG disintegrating tablet Take 1-2 tablets (4-8 mg total) by mouth every 8 (eight) hours as needed for nausea. Can take with nurtec at onset of migraine. 30 tablet 11   SUMAtriptan (IMITREX) 100 MG tablet Take 1 tablet (100 mg total) by mouth 2 (two) times daily as needed. 30 tablet 12   tiZANidine (ZANAFLEX) 4 MG tablet Take 4 mg by mouth every 8 (eight) hours as needed for muscle spasms.     Current Facility-Administered Medications  Medication Dose Route Frequency Provider Last Rate Last Admin   0.9 %  sodium chloride infusion  500 mL Intravenous Continuous Jeriah Corkum, Venia Minks, MD        Allergies as of 05/02/2022 - Review Complete 05/02/2022  Allergen Reaction Noted   Stadol [butorphanol] Other (See Comments) 11/14/2015    Family History  Problem Relation Age of Onset   Non-Hodgkin's lymphoma Mother        Pancreas   Diabetes Father    Migraines Neg Hx    Colon polyps Neg Hx    Colon cancer Neg Hx    Esophageal cancer Neg Hx    Rectal cancer Neg Hx    Stomach cancer Neg Hx     Social History   Socioeconomic History   Marital status: Married    Spouse name: Phineas Douglas"    Number of children: 1   Years of education: 16   Highest education level: Not on file  Occupational History   Occupation: Research scientist (life sciences) [employer]  Tobacco Use   Smoking status: Former    Types: Cigarettes    Quit date: 03/04/2004    Years since quitting: 18.1   Smokeless tobacco: Never  Vaping Use   Vaping Use: Never used  Substance and Sexual Activity   Alcohol use: Not  Currently    Alcohol/week: 0.0 - 7.0 standard drinks of alcohol    Comment: Rare   Drug use: No   Sexual activity: Not on file  Other Topics Concern   Not on file  Social History Narrative   Lives at home with husband and son   Caffeine use:  Drinks decaf drinks    Right handed   Social Determinants of Health   Financial Resource Strain: Not on file  Food Insecurity:  Not on file  Transportation Needs: Not on file  Physical Activity: Not on file  Stress: Not on file  Social Connections: Not on file  Intimate Partner Violence: Not on file    Review of Systems:  All other review of systems negative except as mentioned in the HPI.  Physical Exam: Vital signs in last 24 hours: Blood Pressure 118/74   Pulse 79   Temperature 97.8 F (36.6 C)   Height '5\' 3"'$  (1.6 m)   Weight 120 lb (54.4 kg)   Oxygen Saturation 100%   Body Mass Index 21.26 kg/m  General:   Alert, NAD Lungs:  Clear .   Heart:  Regular rate and rhythm Abdomen:  Soft, nontender and nondistended. Neuro/Psych:  Alert and cooperative. Normal mood and affect. A and O x 3  Reviewed labs, radiology imaging, old records and pertinent past GI work up  Patient is appropriate for planned procedure(s) and anesthesia in an ambulatory setting   K. Denzil Magnuson , MD [phone]

## 2022-05-02 NOTE — Progress Notes (Signed)
Report given to PACU, vss 

## 2022-05-02 NOTE — Progress Notes (Signed)
Pt's states no medical or surgical changes since previsit or office visit. 

## 2022-05-03 ENCOUNTER — Telehealth: Payer: Self-pay

## 2022-05-03 NOTE — Telephone Encounter (Signed)
Left HIPAA compliant voicemail.

## 2022-05-16 ENCOUNTER — Encounter: Payer: Self-pay | Admitting: Gastroenterology

## 2022-05-20 ENCOUNTER — Encounter: Payer: Self-pay | Admitting: Gastroenterology

## 2022-07-02 ENCOUNTER — Ambulatory Visit (INDEPENDENT_AMBULATORY_CARE_PROVIDER_SITE_OTHER): Payer: BLUE CROSS/BLUE SHIELD | Admitting: Neurology

## 2022-07-02 ENCOUNTER — Encounter: Payer: Self-pay | Admitting: Neurology

## 2022-07-02 DIAGNOSIS — G43711 Chronic migraine without aura, intractable, with status migrainosus: Secondary | ICD-10-CM

## 2022-07-02 DIAGNOSIS — G43009 Migraine without aura, not intractable, without status migrainosus: Secondary | ICD-10-CM

## 2022-07-02 MED ORDER — ONABOTULINUMTOXINA 100 UNITS IJ SOLR
155.0000 [IU] | Freq: Once | INTRAMUSCULAR | Status: AC
  Administered 2022-07-02: 155 [IU] via INTRAMUSCULAR

## 2022-07-02 MED ORDER — NURTEC 75 MG PO TBDP: 75.0000 mg | ORAL_TABLET | ORAL | 11 refills | Status: DC

## 2022-07-02 NOTE — Progress Notes (Signed)
Consent Form Botulism Toxin Injection For Chronic Migraine    07/02/2022: stable 04/04/2022 stable 01/10/2022 doing great. Only having 6 migraines a month on botox and < 12 total headache days a month. >>60% improvement in migraine frequency and severity on botox.   Meds ordered this encounter  Medications   botulinum toxin Type A (BOTOX) injection 155 Units    Botox- 100 units x 2 vials Lot: [id number] Expiration: 07/2024 NDC: 4540-9811-91  Bacteriostatic 0.9% Sodium Chloride- 4 mL  Lot: [id number] Expiration: 01/2024 NDC: 21308-657-84  Dx: O96.295 B/B Witnessed by Alveria Apley. CMA   Rimegepant Sulfate (NURTEC) 75 MG TBDP    Sig: Take 1 tablet (75 mg total) by mouth every other day.    Dispense:  16 tablet    Refill:  11    Please enroll patient in nurtec onesource program. Only having 6 migraines a month and < 12 total headache days a month. Failed imitrex, maxalt, relpax, zomig     Update 10/16/2021 JM: Last Botox injection 7 months ago (was rescheduled and had difficulty getting rescheduled).  She has been having a gradual increase in headaches especially over the past few months.  Typically will use Nurtec with resolution but has more recently not been as effective.  Tolerated procedure well today.    03/13/2021 Dr. Lucia Gaskins: She had to increase her estrogen had hot flashes. Otherwise doing great.   12/31/2020 Dr. Lucia Gaskins :stable, still doing well.>80% improvement in headache and migraine frequency. She does not clench. Do the cervical paraspinals and traps, right trap I worse. Nurtec works great for her. 4 migraines a month resolved with nurtec. 1-2 headaches a month. Not taking diamox.  Reviewed orally with patient, additionally signature is on file:  Botulism toxin has been approved by the Federal drug administration for treatment of chronic migraine. Botulism toxin does not cure chronic migraine and it may not be effective in some patients.  The administration of botulism toxin  is accomplished by injecting a small amount of toxin into the muscles of the neck and head. Dosage must be titrated for each individual. Any benefits resulting from botulism toxin tend to wear off after 3 months with a repeat injection required if benefit is to be maintained. Injections are usually done every 3-4 months with maximum effect peak achieved by about 2 or 3 weeks. Botulism toxin is expensive and you should be sure of what costs you will incur resulting from the injection.  The side effects of botulism toxin use for chronic migraine may include:   -Transient, and usually mild, facial weakness with facial injections  -Transient, and usually mild, head or neck weakness with head/neck injections  -Reduction or loss of forehead facial animation due to forehead muscle weakness  -Eyelid drooping  -Dry eye  -Pain at the site of injection or bruising at the site of injection  -Double vision  -Potential unknown long term risks  Contraindications: You should not have Botox if you are pregnant, nursing, allergic to albumin, have an infection, skin condition, or muscle weakness at the site of the injection, or have myasthenia gravis, Lambert-Eaton syndrome, or ALS.  It is also possible that as with any injection, there may be an allergic reaction or no effect from the medication. Reduced effectiveness after repeated injections is sometimes seen and rarely infection at the injection site may occur. All care will be taken to prevent these side effects. If therapy is given over a long time, atrophy and wasting in the muscle injected  may occur. Occasionally the patient's become refractory to treatment because they develop antibodies to the toxin. In this event, therapy needs to be modified.  I have read the above information and consent to the administration of botulism toxin.    PROCEDURE NOTE FOR MIGRAINE HEADACHE    Contraindications and precautions discussed with patient(above). Aseptic  procedure was observed and patient tolerated procedure. Procedure performed by Ihor Austin, AGNP-BC  The condition has existed for more than 6 months, and pt does not have a diagnosis of ALS, Myasthenia Gravis or Lambert-Eaton Syndrome.  Risks and benefits of injections discussed and pt agrees to proceed with the procedure.  Written consent obtained  These injections are medically necessary. Pt  receives good benefits from these injections. These injections do not cause sedations or hallucinations which the oral therapies may cause.  Description of procedure:  The patient was placed in a sitting position. The standard protocol was used for Botox as follows, with 5 units of Botox injected at each site:   -Procerus muscle, midline injection  -Corrugator muscle, bilateral injection  -Frontalis muscle, bilateral injection, with 2 sites each side, medial injection was performed in the upper one third of the frontalis muscle, in the region vertical from the medial inferior edge of the superior orbital rim. The lateral injection was again in the upper one third of the forehead vertically above the lateral limbus of the cornea, 1.5 cm lateral to the medial injection site.  -Temporalis muscle injection, 5 sites, bilaterally. The first injection was 3 cm above the tragus of the ear, second injection site was 1.5 cm to 3 cm up from the first injection site in line with the tragus of the ear. The third injection site was 1.5-3 cm forward between the first 2 injection sites. The fourth injection site was 1.5 cm posterior to the second injection site.   -Occipitalis muscle injection, 3 sites, bilaterally. The first injection was done one half way between the occipital protuberance and the tip of the mastoid process behind the ear. The second injection site was done lateral and superior to the first, 1 fingerbreadth from the first injection. The third injection site was 1 fingerbreadth superiorly and medially  from the first injection site.  -Cervical paraspinal muscle injection, 2 sites, bilateral knee first injection site was 1 cm from the midline of the cervical spine, 3 cm inferior to the lower border of the occipital protuberance. The second injection site was 1.5 cm superiorly and laterally to the first injection site.  -Trapezius muscle injection was performed at 3 sites, bilaterally. The first injection site was in the upper trapezius muscle halfway between the inflection point of the neck, and the acromion. The second injection site was one half way between the acromion and the first injection site. The third injection was done between the first injection site and the inflection point of the neck.   Will return for repeat injection in 3 months.   A 155 unit sof Botox was used, 45u Botox not injected was wasted. The patient tolerated the procedure well, there were no complications of the above procedure.

## 2022-07-02 NOTE — Progress Notes (Signed)
Botox- 100 units x 2 vials Lot: [id number] Expiration: 07/2024 NDC: 4010-2725-36  Bacteriostatic 0.9% Sodium Chloride- 4 mL  Lot: [id number] Expiration: 01/2024 NDC: 42595-638-75  Dx: I43.329 B/B Witnessed by Alveria Apley. CMA

## 2022-07-11 ENCOUNTER — Other Ambulatory Visit (HOSPITAL_COMMUNITY): Payer: Self-pay

## 2022-07-15 ENCOUNTER — Other Ambulatory Visit (HOSPITAL_COMMUNITY): Payer: Self-pay

## 2022-07-29 ENCOUNTER — Other Ambulatory Visit (HOSPITAL_COMMUNITY): Payer: Self-pay

## 2022-07-29 ENCOUNTER — Telehealth: Payer: Self-pay | Admitting: Pharmacy Technician

## 2022-07-29 NOTE — Telephone Encounter (Signed)
Patient Advocate Encounter   Received notification that prior authorization for Nurtec 75MG  dispersible tablets is required.   PA submitted on 07/29/2022 [facility] [employer] Electronic PA Form Status is pending

## 2022-07-29 NOTE — Telephone Encounter (Signed)
Pharmacy Patient Advocate Encounter  Prior Authorization for Nurtec 75MG  dispersible tablets has been approved by [name] (ins).    PA # PA Case ID #: [id number] Effective dates: 07/29/2022 through 07/29/2023

## 2022-09-24 ENCOUNTER — Ambulatory Visit (INDEPENDENT_AMBULATORY_CARE_PROVIDER_SITE_OTHER): Payer: BLUE CROSS/BLUE SHIELD | Admitting: Neurology

## 2022-09-24 DIAGNOSIS — G43711 Chronic migraine without aura, intractable, with status migrainosus: Secondary | ICD-10-CM

## 2022-09-24 MED ORDER — ONABOTULINUMTOXINA 200 UNITS IJ SOLR
155.0000 [IU] | Freq: Once | INTRAMUSCULAR | Status: AC
  Administered 2022-09-24: 155 [IU] via INTRAMUSCULAR

## 2022-09-24 NOTE — Progress Notes (Signed)
Lot: [id number] Expiration: 02/2025 NDC: 3086-5784-69  Bacteriostatic 0.9% Sodium Chloride-  4 mL  Lot: [id number] Expiration: 01/03/2024 NDC: 4132-4401-02  Dx: V25.366  B/B Witnessed by Maryjean Ka

## 2022-09-24 NOTE — Progress Notes (Signed)
Consent Form Botulism Toxin Injection For Chronic Migraine   09/24/2022: stable 07/02/2022: stable 04/04/2022 stable 01/10/2022 doing great. Only having 6 migraines a month on botox and < 12 total headache days a month. >>60% improvement in migraine frequency and severity on botox.   Meds ordered this encounter  Medications   botulinum toxin Type A (BOTOX) injection 155 Units    Botox- 200 units x 1 vial Lot: [id number] Expiration: 02/2025 NDC: 2841-3244-01  Bacteriostatic 0.9% Sodium Chloride-  4 mL  Lot: [id number] Expiration: 01/03/2024 NDC: 6644-0347-42  Dx: V95.638  B/B Witnessed by Diamond,CMA     Update 10/16/2021 JM: Last Botox injection 7 months ago (was rescheduled and had difficulty getting rescheduled).  She has been having a gradual increase in headaches especially over the past few months.  Typically will use Nurtec with resolution but has more recently not been as effective.  Tolerated procedure well today.    03/13/2021 Dr. Lucia Gaskins: She had to increase her estrogen had hot flashes. Otherwise doing great.   12/31/2020 Dr. Lucia Gaskins :stable, still doing well.>80% improvement in headache and migraine frequency. She does not clench. Do the cervical paraspinals and traps, right trap I worse. Nurtec works great for her. 4 migraines a month resolved with nurtec. 1-2 headaches a month. Not taking diamox.  Reviewed orally with patient, additionally signature is on file:  Botulism toxin has been approved by the Federal drug administration for treatment of chronic migraine. Botulism toxin does not cure chronic migraine and it may not be effective in some patients.  The administration of botulism toxin is accomplished by injecting a small amount of toxin into the muscles of the neck and head. Dosage must be titrated for each individual. Any benefits resulting from botulism toxin tend to wear off after 3 months with a repeat injection required if benefit is to be maintained. Injections are  usually done every 3-4 months with maximum effect peak achieved by about 2 or 3 weeks. Botulism toxin is expensive and you should be sure of what costs you will incur resulting from the injection.  The side effects of botulism toxin use for chronic migraine may include:   -Transient, and usually mild, facial weakness with facial injections  -Transient, and usually mild, head or neck weakness with head/neck injections  -Reduction or loss of forehead facial animation due to forehead muscle weakness  -Eyelid drooping  -Dry eye  -Pain at the site of injection or bruising at the site of injection  -Double vision  -Potential unknown long term risks  Contraindications: You should not have Botox if you are pregnant, nursing, allergic to albumin, have an infection, skin condition, or muscle weakness at the site of the injection, or have myasthenia gravis, Lambert-Eaton syndrome, or ALS.  It is also possible that as with any injection, there may be an allergic reaction or no effect from the medication. Reduced effectiveness after repeated injections is sometimes seen and rarely infection at the injection site may occur. All care will be taken to prevent these side effects. If therapy is given over a long time, atrophy and wasting in the muscle injected may occur. Occasionally the patient's become refractory to treatment because they develop antibodies to the toxin. In this event, therapy needs to be modified.  I have read the above information and consent to the administration of botulism toxin.    PROCEDURE NOTE FOR MIGRAINE HEADACHE    Contraindications and precautions discussed with patient(above). Aseptic procedure was observed and patient tolerated procedure.  Procedure performed by Ihor Austin, AGNP-BC  The condition has existed for more than 6 months, and pt does not have a diagnosis of ALS, Myasthenia Gravis or Lambert-Eaton Syndrome.  Risks and benefits of injections discussed and pt  agrees to proceed with the procedure.  Written consent obtained  These injections are medically necessary. Pt  receives good benefits from these injections. These injections do not cause sedations or hallucinations which the oral therapies may cause.  Description of procedure:  The patient was placed in a sitting position. The standard protocol was used for Botox as follows, with 5 units of Botox injected at each site:   -Procerus muscle, midline injection  -Corrugator muscle, bilateral injection  -Frontalis muscle, bilateral injection, with 2 sites each side, medial injection was performed in the upper one third of the frontalis muscle, in the region vertical from the medial inferior edge of the superior orbital rim. The lateral injection was again in the upper one third of the forehead vertically above the lateral limbus of the cornea, 1.5 cm lateral to the medial injection site.  -Temporalis muscle injection, 5 sites, bilaterally. The first injection was 3 cm above the tragus of the ear, second injection site was 1.5 cm to 3 cm up from the first injection site in line with the tragus of the ear. The third injection site was 1.5-3 cm forward between the first 2 injection sites. The fourth injection site was 1.5 cm posterior to the second injection site.   -Occipitalis muscle injection, 3 sites, bilaterally. The first injection was done one half way between the occipital protuberance and the tip of the mastoid process behind the ear. The second injection site was done lateral and superior to the first, 1 fingerbreadth from the first injection. The third injection site was 1 fingerbreadth superiorly and medially from the first injection site.  -Cervical paraspinal muscle injection, 2 sites, bilateral knee first injection site was 1 cm from the midline of the cervical spine, 3 cm inferior to the lower border of the occipital protuberance. The second injection site was 1.5 cm superiorly and  laterally to the first injection site.  -Trapezius muscle injection was performed at 3 sites, bilaterally. The first injection site was in the upper trapezius muscle halfway between the inflection point of the neck, and the acromion. The second injection site was one half way between the acromion and the first injection site. The third injection was done between the first injection site and the inflection point of the neck.   Will return for repeat injection in 3 months.   A 155 unit sof Botox was used, 45u Botox not injected was wasted. The patient tolerated the procedure well, there were no complications of the above procedure.

## 2022-12-18 ENCOUNTER — Ambulatory Visit: Payer: BLUE CROSS/BLUE SHIELD | Admitting: Neurology

## 2022-12-18 DIAGNOSIS — G43711 Chronic migraine without aura, intractable, with status migrainosus: Secondary | ICD-10-CM

## 2022-12-18 MED ORDER — ONABOTULINUMTOXINA 200 UNITS IJ SOLR
155.0000 [IU] | Freq: Once | INTRAMUSCULAR | Status: AC
  Administered 2022-12-18: 155 [IU] via INTRAMUSCULAR

## 2022-12-18 NOTE — Progress Notes (Signed)
Consent Form Botulism Toxin Injection For Chronic Migraine  12/12/2022: stable 09/24/2022: stable 07/02/2022: stable 04/04/2022 stable 01/10/2022 doing great. Only having 6 migraines a month on botox and < 12 total headache days a month. >>60% improvement in migraine frequency and severity on botox.   Meds ordered this encounter  Medications   botulinum toxin Type A (BOTOX) injection 155 Units    Botox- 200 units x 1 vial Lot: [id number] Expiration: 02/2025 NDC: 9562-1308-65  Bacteriostatic 0.9% Sodium Chloride-  4mL  Lot: [id number] Expiration: 06/03/2023 NDC: 2952-8413-24  Dx: M01.027 B/B Witnessed by Elana Alm     Update 10/16/2021 JM: Last Botox injection 7 months ago (was rescheduled and had difficulty getting rescheduled).  She has been having a gradual increase in headaches especially over the past few months.  Typically will use Nurtec with resolution but has more recently not been as effective.  Tolerated procedure well today.    03/13/2021 Dr. Lucia Gaskins: She had to increase her estrogen had hot flashes. Otherwise doing great.   12/31/2020 Dr. Lucia Gaskins :stable, still doing well.>80% improvement in headache and migraine frequency. She does not clench. Do the cervical paraspinals and traps, right trap I worse. Nurtec works great for her. 4 migraines a month resolved with nurtec. 1-2 headaches a month. Not taking diamox.  Reviewed orally with patient, additionally signature is on file:  Botulism toxin has been approved by the Federal drug administration for treatment of chronic migraine. Botulism toxin does not cure chronic migraine and it may not be effective in some patients.  The administration of botulism toxin is accomplished by injecting a small amount of toxin into the muscles of the neck and head. Dosage must be titrated for each individual. Any benefits resulting from botulism toxin tend to wear off after 3 months with a repeat injection required if benefit is to be  maintained. Injections are usually done every 3-4 months with maximum effect peak achieved by about 2 or 3 weeks. Botulism toxin is expensive and you should be sure of what costs you will incur resulting from the injection.  The side effects of botulism toxin use for chronic migraine may include:   -Transient, and usually mild, facial weakness with facial injections  -Transient, and usually mild, head or neck weakness with head/neck injections  -Reduction or loss of forehead facial animation due to forehead muscle weakness  -Eyelid drooping  -Dry eye  -Pain at the site of injection or bruising at the site of injection  -Double vision  -Potential unknown long term risks  Contraindications: You should not have Botox if you are pregnant, nursing, allergic to albumin, have an infection, skin condition, or muscle weakness at the site of the injection, or have myasthenia gravis, Lambert-Eaton syndrome, or ALS.  It is also possible that as with any injection, there may be an allergic reaction or no effect from the medication. Reduced effectiveness after repeated injections is sometimes seen and rarely infection at the injection site may occur. All care will be taken to prevent these side effects. If therapy is given over a long time, atrophy and wasting in the muscle injected may occur. Occasionally the patient's become refractory to treatment because they develop antibodies to the toxin. In this event, therapy needs to be modified.  I have read the above information and consent to the administration of botulism toxin.    PROCEDURE NOTE FOR MIGRAINE HEADACHE    Contraindications and precautions discussed with patient(above). Aseptic procedure was observed and patient tolerated procedure. Procedure  performed by Ihor Austin, AGNP-BC  The condition has existed for more than 6 months, and pt does not have a diagnosis of ALS, Myasthenia Gravis or Lambert-Eaton Syndrome.  Risks and benefits of  injections discussed and pt agrees to proceed with the procedure.  Written consent obtained  These injections are medically necessary. Pt  receives good benefits from these injections. These injections do not cause sedations or hallucinations which the oral therapies may cause.  Description of procedure:  The patient was placed in a sitting position. The standard protocol was used for Botox as follows, with 5 units of Botox injected at each site:   -Procerus muscle, midline injection  -Corrugator muscle, bilateral injection  -Frontalis muscle, bilateral injection, with 2 sites each side, medial injection was performed in the upper one third of the frontalis muscle, in the region vertical from the medial inferior edge of the superior orbital rim. The lateral injection was again in the upper one third of the forehead vertically above the lateral limbus of the cornea, 1.5 cm lateral to the medial injection site.  -Temporalis muscle injection, 5 sites, bilaterally. The first injection was 3 cm above the tragus of the ear, second injection site was 1.5 cm to 3 cm up from the first injection site in line with the tragus of the ear. The third injection site was 1.5-3 cm forward between the first 2 injection sites. The fourth injection site was 1.5 cm posterior to the second injection site.   -Occipitalis muscle injection, 3 sites, bilaterally. The first injection was done one half way between the occipital protuberance and the tip of the mastoid process behind the ear. The second injection site was done lateral and superior to the first, 1 fingerbreadth from the first injection. The third injection site was 1 fingerbreadth superiorly and medially from the first injection site.  -Cervical paraspinal muscle injection, 2 sites, bilateral knee first injection site was 1 cm from the midline of the cervical spine, 3 cm inferior to the lower border of the occipital protuberance. The second injection site was  1.5 cm superiorly and laterally to the first injection site.  -Trapezius muscle injection was performed at 3 sites, bilaterally. The first injection site was in the upper trapezius muscle halfway between the inflection point of the neck, and the acromion. The second injection site was one half way between the acromion and the first injection site. The third injection was done between the first injection site and the inflection point of the neck.   Will return for repeat injection in 3 months.   A 155 unit sof Botox was used, 45u Botox not injected was wasted. The patient tolerated the procedure well, there were no complications of the above procedure.

## 2022-12-18 NOTE — Progress Notes (Signed)
Botox- 200 units x 1 vial Lot: [id number] Expiration: 02/2025 NDC: 5409-8119-14  Bacteriostatic 0.9% Sodium Chloride-  4mL  Lot: [id number] Expiration: 06/03/2023 NDC: 2130-8657-84  Dx: O96.295 B/B Witnessed by Elana Alm

## 2023-03-10 ENCOUNTER — Telehealth: Payer: Self-pay | Admitting: Neurology

## 2023-03-10 NOTE — Telephone Encounter (Signed)
 Pt rs/ appt due to ins issues.

## 2023-03-13 ENCOUNTER — Ambulatory Visit: Payer: BLUE CROSS/BLUE SHIELD | Admitting: Neurology

## 2023-04-30 ENCOUNTER — Telehealth: Payer: Self-pay | Admitting: Neurology

## 2023-04-30 NOTE — Telephone Encounter (Signed)
 Pt's Botox auth expired 1/30, called [name] to initiate new auth. Spoke with Leonette Most, he was able to submit auth over the phone. Clinical notes faxed to [phone] with pending case # [id number].

## 2023-05-15 NOTE — Telephone Encounter (Signed)
 Called [name] to check status, spoke with Vee. She states Berkley Harvey has been approved. Call reference # is [id number].  Auth#: [id number] (04/30/23-04/28/24)

## 2023-05-22 ENCOUNTER — Ambulatory Visit: Payer: BLUE CROSS/BLUE SHIELD | Admitting: Neurology

## 2023-05-22 DIAGNOSIS — G43711 Chronic migraine without aura, intractable, with status migrainosus: Secondary | ICD-10-CM | POA: Diagnosis not present

## 2023-05-22 DIAGNOSIS — G43009 Migraine without aura, not intractable, without status migrainosus: Secondary | ICD-10-CM

## 2023-05-22 DIAGNOSIS — G43709 Chronic migraine without aura, not intractable, without status migrainosus: Secondary | ICD-10-CM

## 2023-05-22 MED ORDER — ONABOTULINUMTOXINA 200 UNITS IJ SOLR
155.0000 [IU] | Freq: Once | INTRAMUSCULAR | Status: AC
  Administered 2023-05-22: 155 [IU] via INTRAMUSCULAR

## 2023-05-22 NOTE — Progress Notes (Signed)
 Botox- 200 units x 1 vial Lot: [id number] Expiration: 06/2025 NDC: 1478-2956-21  Bacteriostatic 0.9% Sodium Chloride- * mL  Lot: [id number] Expiration: 01/03/2024 NDC: 8469-6295-28  Dx: U13.244 B/B Witnessed by Alverda Skeans, RN

## 2023-05-22 NOTE — Progress Notes (Signed)
 Consent Form 05/22/2023: stable. Only having 6 migraines a month on botox and < 12 total headache days a month. >>60% improvement in migraine frequency and severity on botox. Patient feels that her migraines cause her jaw to ache in her jaw aching also can cause her migraines to worsen it is a trigger for migraines included 5 units in each masseter to see if that helps with migraine severity.  Botulism Toxin Injection For Chronic Migraine  12/12/2022: stable 09/24/2022: stable 07/02/2022: stable 04/04/2022 stable 01/10/2022 doing great. Only having 6 migraines a month on botox and < 12 total headache days a month. >>60% improvement in migraine frequency and severity on botox.   Meds ordered this encounter  Medications   botulinum toxin Type A (BOTOX) injection 155 Units     Update 10/16/2021 JM: Last Botox injection 7 months ago (was rescheduled and had difficulty getting rescheduled).  She has been having a gradual increase in headaches especially over the past few months.  Typically will use Nurtec with resolution but has more recently not been as effective.  Tolerated procedure well today.    03/13/2021 Dr. Lucia Gaskins: She had to increase her estrogen had hot flashes. Otherwise doing great.   12/31/2020 Dr. Lucia Gaskins :stable, still doing well.>80% improvement in headache and migraine frequency. She does not clench. Do the cervical paraspinals and traps, right trap I worse. Nurtec works great for her. 4 migraines a month resolved with nurtec. 1-2 headaches a month. Not taking diamox.  Reviewed orally with patient, additionally signature is on file:  Botulism toxin has been approved by the Federal drug administration for treatment of chronic migraine. Botulism toxin does not cure chronic migraine and it may not be effective in some patients.  The administration of botulism toxin is accomplished by injecting a small amount of toxin into the muscles of the neck and head. Dosage must be titrated for  each individual. Any benefits resulting from botulism toxin tend to wear off after 3 months with a repeat injection required if benefit is to be maintained. Injections are usually done every 3-4 months with maximum effect peak achieved by about 2 or 3 weeks. Botulism toxin is expensive and you should be sure of what costs you will incur resulting from the injection.  The side effects of botulism toxin use for chronic migraine may include:   -Transient, and usually mild, facial weakness with facial injections  -Transient, and usually mild, head or neck weakness with head/neck injections  -Reduction or loss of forehead facial animation due to forehead muscle weakness  -Eyelid drooping  -Dry eye  -Pain at the site of injection or bruising at the site of injection  -Double vision  -Potential unknown long term risks  Contraindications: You should not have Botox if you are pregnant, nursing, allergic to albumin, have an infection, skin condition, or muscle weakness at the site of the injection, or have myasthenia gravis, Lambert-Eaton syndrome, or ALS.  It is also possible that as with any injection, there may be an allergic reaction or no effect from the medication. Reduced effectiveness after repeated injections is sometimes seen and rarely infection at the injection site may occur. All care will be taken to prevent these side effects. If therapy is given over a long time, atrophy and wasting in the muscle injected may occur. Occasionally the patient's become refractory to treatment because they develop antibodies to the toxin. In this event, therapy needs to be modified.  I have read the above information and  consent to the administration of botulism toxin.    PROCEDURE NOTE FOR MIGRAINE HEADACHE    Contraindications and precautions discussed with patient(above). Aseptic procedure was observed and patient tolerated procedure. Procedure performed by Ihor Austin, AGNP-BC  The condition has  existed for more than 6 months, and pt does not have a diagnosis of ALS, Myasthenia Gravis or Lambert-Eaton Syndrome.  Risks and benefits of injections discussed and pt agrees to proceed with the procedure.  Written consent obtained  These injections are medically necessary. Pt  receives good benefits from these injections. These injections do not cause sedations or hallucinations which the oral therapies may cause.  Description of procedure:  The patient was placed in a sitting position. The standard protocol was used for Botox as follows, with 5 units of Botox injected at each site:   -Procerus muscle, midline injection  -Corrugator muscle, bilateral injection  -Frontalis muscle, bilateral injection, with 2 sites each side, medial injection was performed in the upper one third of the frontalis muscle, in the region vertical from the medial inferior edge of the superior orbital rim. The lateral injection was again in the upper one third of the forehead vertically above the lateral limbus of the cornea, 1.5 cm lateral to the medial injection site.  -Temporalis muscle injection, 5 sites, bilaterally. The first injection was 3 cm above the tragus of the ear, second injection site was 1.5 cm to 3 cm up from the first injection site in line with the tragus of the ear. The third injection site was 1.5-3 cm forward between the first 2 injection sites. The fourth injection site was 1.5 cm posterior to the second injection site.   -Occipitalis muscle injection, 3 sites, bilaterally. The first injection was done one half way between the occipital protuberance and the tip of the mastoid process behind the ear. The second injection site was done lateral and superior to the first, 1 fingerbreadth from the first injection. The third injection site was 1 fingerbreadth superiorly and medially from the first injection site.  -Cervical paraspinal muscle injection, 2 sites, bilateral knee first injection site was 1  cm from the midline of the cervical spine, 3 cm inferior to the lower border of the occipital protuberance. The second injection site was 1.5 cm superiorly and laterally to the first injection site.  -Trapezius muscle injection was performed at 3 sites, bilaterally. The first injection site was in the upper trapezius muscle halfway between the inflection point of the neck, and the acromion. The second injection site was one half way between the acromion and the first injection site. The third injection was done between the first injection site and the inflection point of the neck.   Will return for repeat injection in 3 months.   A 155 unit sof Botox was used, 45u Botox not injected was wasted. The patient tolerated the procedure well, there were no complications of the above procedure.

## 2023-08-25 ENCOUNTER — Telehealth: Payer: Self-pay | Admitting: Neurology

## 2023-08-25 ENCOUNTER — Encounter: Payer: Self-pay | Admitting: Neurology

## 2023-08-25 ENCOUNTER — Ambulatory Visit (INDEPENDENT_AMBULATORY_CARE_PROVIDER_SITE_OTHER): Payer: Self-pay | Admitting: Neurology

## 2023-08-25 VITALS — BP 114/69 | HR 72

## 2023-08-25 DIAGNOSIS — G43701 Chronic migraine without aura, not intractable, with status migrainosus: Secondary | ICD-10-CM | POA: Diagnosis not present

## 2023-08-25 NOTE — Progress Notes (Signed)
 Consent Form 08/25/2023: stable 05/22/2023: stable. Only having 6 migraines a month on botox  and < 12 total headache days a month. >>60% improvement in migraine frequency and severity on botox . Patient feels that her migraines cause her jaw to ache in her jaw aching also can cause her migraines to worsen it is a trigger for migraines included 5 units in each masseter to see if that helps with migraine severity.  Botulism Toxin Injection For Chronic Migraine  12/12/2022: stable 09/24/2022: stable 07/02/2022: stable 04/04/2022 stable 01/10/2022 doing great. Only having 6 migraines a month on botox  and < 12 total headache days a month. >>60% improvement in migraine frequency and severity on botox .   No orders of the defined types were placed in this encounter.    Update 10/16/2021 JM: Last Botox  injection 7 months ago (was rescheduled and had difficulty getting rescheduled).  She has been having a gradual increase in headaches especially over the past few months.  Typically will use Nurtec with resolution but has more recently not been as effective.  Tolerated procedure well today.    03/13/2021 Dr. Ines: She had to increase her estrogen had hot flashes. Otherwise doing great.   12/31/2020 Dr. Ines :stable, still doing well.>80% improvement in headache and migraine frequency. She does not clench. Do the cervical paraspinals and traps, right trap I worse. Nurtec works great for her. 4 migraines a month resolved with nurtec. 1-2 headaches a month. Not taking diamox .  Reviewed orally with patient, additionally signature is on file:  Botulism toxin has been approved by the Federal drug administration for treatment of chronic migraine. Botulism toxin does not cure chronic migraine and it may not be effective in some patients.  The administration of botulism toxin is accomplished by injecting a small amount of toxin into the muscles of the neck and head. Dosage must be titrated for each individual.  Any benefits resulting from botulism toxin tend to wear off after 3 months with a repeat injection required if benefit is to be maintained. Injections are usually done every 3-4 months with maximum effect peak achieved by about 2 or 3 weeks. Botulism toxin is expensive and you should be sure of what costs you will incur resulting from the injection.  The side effects of botulism toxin use for chronic migraine may include:   -Transient, and usually mild, facial weakness with facial injections  -Transient, and usually mild, head or neck weakness with head/neck injections  -Reduction or loss of forehead facial animation due to forehead muscle weakness  -Eyelid drooping  -Dry eye  -Pain at the site of injection or bruising at the site of injection  -Double vision  -Potential unknown long term risks  Contraindications: You should not have Botox  if you are pregnant, nursing, allergic to albumin, have an infection, skin condition, or muscle weakness at the site of the injection, or have myasthenia gravis, Lambert-Eaton syndrome, or ALS.  It is also possible that as with any injection, there may be an allergic reaction or no effect from the medication. Reduced effectiveness after repeated injections is sometimes seen and rarely infection at the injection site may occur. All care will be taken to prevent these side effects. If therapy is given over a long time, atrophy and wasting in the muscle injected may occur. Occasionally the patient's become refractory to treatment because they develop antibodies to the toxin. In this event, therapy needs to be modified.  I have read the above information and consent to the administration  of botulism toxin.    PROCEDURE NOTE FOR MIGRAINE HEADACHE    Contraindications and precautions discussed with patient(above). Aseptic procedure was observed and patient tolerated procedure. Procedure performed by Harlene Bogaert, AGNP-BC  The condition has existed for more  than 6 months, and pt does not have a diagnosis of ALS, Myasthenia Gravis or Lambert-Eaton Syndrome.  Risks and benefits of injections discussed and pt agrees to proceed with the procedure.  Written consent obtained  These injections are medically necessary. Pt  receives good benefits from these injections. These injections do not cause sedations or hallucinations which the oral therapies may cause.  Description of procedure:  The patient was placed in a sitting position. The standard protocol was used for Botox  as follows, with 5 units of Botox  injected at each site:   -Procerus muscle, midline injection  -Corrugator muscle, bilateral injection  -Frontalis muscle, bilateral injection, with 2 sites each side, medial injection was performed in the upper one third of the frontalis muscle, in the region vertical from the medial inferior edge of the superior orbital rim. The lateral injection was again in the upper one third of the forehead vertically above the lateral limbus of the cornea, 1.5 cm lateral to the medial injection site.  -Temporalis muscle injection, 5 sites, bilaterally. The first injection was 3 cm above the tragus of the ear, second injection site was 1.5 cm to 3 cm up from the first injection site in line with the tragus of the ear. The third injection site was 1.5-3 cm forward between the first 2 injection sites. The fourth injection site was 1.5 cm posterior to the second injection site.   -Occipitalis muscle injection, 3 sites, bilaterally. The first injection was done one half way between the occipital protuberance and the tip of the mastoid process behind the ear. The second injection site was done lateral and superior to the first, 1 fingerbreadth from the first injection. The third injection site was 1 fingerbreadth superiorly and medially from the first injection site.  -Cervical paraspinal muscle injection, 2 sites, bilateral knee first injection site was 1 cm from the  midline of the cervical spine, 3 cm inferior to the lower border of the occipital protuberance. The second injection site was 1.5 cm superiorly and laterally to the first injection site.  -Trapezius muscle injection was performed at 3 sites, bilaterally. The first injection site was in the upper trapezius muscle halfway between the inflection point of the neck, and the acromion. The second injection site was one half way between the acromion and the first injection site. The third injection was done between the first injection site and the inflection point of the neck.   Will return for repeat injection in 3 months.   A 155 unit sof Botox  was used, 45u Botox  not injected was wasted. The patient tolerated the procedure well, there were no complications of the above procedure.

## 2023-08-25 NOTE — Progress Notes (Signed)
 Botox - 200 units x 1 vial Lot: [id number] Expiration: 02/2025 NDC: 9976-6078-97   Bacteriostatic 0.9% Sodium Chloride - 4mL  Lot: [id number] Expiration: 11/31/2026 NDC: 9590-8033-97   Dx: H56.288 SAMPLES Witnessed by Rojean Hurst, CMA

## 2023-08-25 NOTE — Telephone Encounter (Signed)
Patient called to verify appointment 

## 2023-09-10 ENCOUNTER — Encounter: Payer: Self-pay | Admitting: Neurology

## 2023-09-10 DIAGNOSIS — G43009 Migraine without aura, not intractable, without status migrainosus: Secondary | ICD-10-CM

## 2023-09-10 MED ORDER — NURTEC 75 MG PO TBDP: 75.0000 mg | ORAL_TABLET | ORAL | 11 refills | Status: AC

## 2023-11-18 ENCOUNTER — Ambulatory Visit: Admitting: Neurology

## 2023-11-26 ENCOUNTER — Telehealth: Payer: Self-pay | Admitting: Neurology

## 2023-11-26 DIAGNOSIS — G43701 Chronic migraine without aura, not intractable, with status migrainosus: Secondary | ICD-10-CM

## 2023-11-26 NOTE — Telephone Encounter (Signed)
 Called [name] and ended previous auth for B/B, submitted new auth request for SP under [facility]. Jenna Fitzgerald is pending with case # [id number], faxed notes to [phone].

## 2023-12-03 NOTE — Telephone Encounter (Signed)
 LVM and sent MyChart msg asking pt to call me back and r/s Botox  to another provider.

## 2023-12-04 ENCOUNTER — Ambulatory Visit: Payer: Self-pay | Admitting: Neurology

## 2023-12-08 NOTE — Telephone Encounter (Signed)
 Pt called me back and r/s for 11/4 @ 9:45 am with Harlene, she has seen her in the past.

## 2023-12-09 MED ORDER — ONABOTULINUMTOXINA 200 UNITS IJ SOLR: INTRAMUSCULAR | 3 refills | Status: AC

## 2023-12-09 NOTE — Addendum Note (Signed)
 Addended by: HILLIARD HEATHER CROME on: 12/09/2023 09:58 AM   Modules accepted: Orders

## 2023-12-09 NOTE — Telephone Encounter (Signed)
 Jenna Fitzgerald was approved, please send rx to [facility] in [place] (NPI: [id number]).  Auth#: [id number] (11/26/23-11/24/24)

## 2023-12-09 NOTE — Addendum Note (Signed)
 Addended by: WHITFIELD RAISIN L on: 12/09/2023 04:26 PM   Modules accepted: Orders

## 2023-12-23 ENCOUNTER — Ambulatory Visit: Admitting: Neurology

## 2024-01-05 NOTE — Telephone Encounter (Signed)
 Called [facility] @ [phone] to initiate auth via pharmacy benefit per [facility]- they are refusing to fill under medical benefit. Auth was approved.  Auth#: [id number] (01/05/24-01/04/25)

## 2024-01-06 ENCOUNTER — Ambulatory Visit: Admitting: Adult Health

## 2024-01-27 ENCOUNTER — Ambulatory Visit: Admitting: Adult Health

## 2024-02-03 ENCOUNTER — Ambulatory Visit (INDEPENDENT_AMBULATORY_CARE_PROVIDER_SITE_OTHER): Admitting: Adult Health

## 2024-02-03 DIAGNOSIS — G43701 Chronic migraine without aura, not intractable, with status migrainosus: Secondary | ICD-10-CM

## 2024-02-03 MED ORDER — ONABOTULINUMTOXINA 200 UNITS IJ SOLR
155.0000 [IU] | Freq: Once | INTRAMUSCULAR | Status: AC
  Administered 2024-02-03: 165 [IU] via INTRAMUSCULAR

## 2024-02-03 NOTE — Progress Notes (Signed)
 Update 02/03/2024 JM: Patient returns for repeat Botox .  Prior injection 08/25/2023 with Dr. Ines. Migraines remain well controlled on botox  therapy. She has had some increased migraines this past week, unsure if due to weather or prolonged time in between injections. Use of Nurtec as needed with benefit.  No longer uses sumatriptan  due to side effects.  Does have jaw clenching/grinding which can trigger headaches, notes benefit with masseter injections. Tolerated procedure well today. Return in 3 months for repeat injection.         Consent Form Botulism Toxin Injection For Chronic Migraine    Reviewed orally with patient, additionally signature is on file:  Botulism toxin has been approved by the Federal drug administration for treatment of chronic migraine. Botulism toxin does not cure chronic migraine and it may not be effective in some patients.  The administration of botulism toxin is accomplished by injecting a small amount of toxin into the muscles of the neck and head. Dosage must be titrated for each individual. Any benefits resulting from botulism toxin tend to wear off after 3 months with a repeat injection required if benefit is to be maintained. Injections are usually done every 3-4 months with maximum effect peak achieved by about 2 or 3 weeks. Botulism toxin is expensive and you should be sure of what costs you will incur resulting from the injection.  The side effects of botulism toxin use for chronic migraine may include:   -Transient, and usually mild, facial weakness with facial injections  -Transient, and usually mild, head or neck weakness with head/neck injections  -Reduction or loss of forehead facial animation due to forehead muscle weakness  -Eyelid drooping  -Dry eye  -Pain at the site of injection or bruising at the site of injection  -Double vision  -Potential unknown long term risks   Contraindications: You should not have Botox  if you are  pregnant, nursing, allergic to albumin, have an infection, skin condition, or muscle weakness at the site of the injection, or have myasthenia gravis, Lambert-Eaton syndrome, or ALS.  It is also possible that as with any injection, there may be an allergic reaction or no effect from the medication. Reduced effectiveness after repeated injections is sometimes seen and rarely infection at the injection site may occur. All care will be taken to prevent these side effects. If therapy is given over a long time, atrophy and wasting in the muscle injected may occur. Occasionally the patient's become refractory to treatment because they develop antibodies to the toxin. In this event, therapy needs to be modified.  I have read the above information and consent to the administration of botulism toxin.    BOTOX  PROCEDURE NOTE FOR MIGRAINE HEADACHE  Contraindications and precautions discussed with patient(above). Aseptic procedure was observed and patient tolerated procedure. Procedure performed by Harlene Bogaert, AGNP-BC.   The condition has existed for more than 6 months, and pt does not have a diagnosis of ALS, Myasthenia Gravis or Lambert-Eaton Syndrome.  Risks and benefits of injections discussed and pt agrees to proceed with the procedure.  Written consent obtained  These injections are medically necessary. Pt  receives good benefits from these injections. These injections do not cause sedations or hallucinations which the oral therapies may cause.   Description of procedure:  The patient was placed in a sitting position. The standard protocol was used for Botox  as follows, with 5 units of Botox  injected at each site:  -Procerus muscle, midline injection  -Corrugator muscle,  bilateral injection  -Frontalis muscle, bilateral injection, with 2 sites each side, medial injection was performed in the upper one third of the frontalis muscle, in the region vertical from the medial inferior edge of the  superior orbital rim. The lateral injection was again in the upper one third of the forehead vertically above the lateral limbus of the cornea, 1.5 cm lateral to the medial injection site.  -Temporalis muscle injection, 4 sites, bilaterally. The first injection was 3 cm above the tragus of the ear, second injection site was 1.5 cm to 3 cm up from the first injection site in line with the tragus of the ear. The third injection site was 1.5-3 cm forward between the first 2 injection sites. The fourth injection site was 1.5 cm posterior to the second injection site. 5th site laterally in the temporalis  muscleat the level of the outer canthus.  -Occipitalis muscle injection, 3 sites, bilaterally. The first injection was done one half way between the occipital protuberance and the tip of the mastoid process behind the ear. The second injection site was done lateral and superior to the first, 1 fingerbreadth from the first injection. The third injection site was 1 fingerbreadth superiorly and medially from the first injection site.  -Cervical paraspinal muscle injection, 2 sites, bilaterally. The first injection site was 1 cm from the midline of the cervical spine, 3 cm inferior to the lower border of the occipital protuberance. The second injection site was 1.5 cm superiorly and laterally to the first injection site.  -Trapezius muscle injection was performed at 3 sites, bilaterally. The first injection site was in the upper trapezius muscle halfway between the inflection point of the neck, and the acromion. The second injection site was one half way between the acromion and the first injection site. The third injection was done between the first injection site and the inflection point of the neck.  - Masseter muscle injection was performed at 1 site, bilaterally    A total of 200 units of Botox  was prepared, 165 units of Botox  was injected as documented above, any Botox  not injected was wasted. The  patient tolerated the procedure well, there were no complications of the above procedure.   Harlene Bogaert, AGNP-BC  [facility] [address] [place], [place] [address]  Phone [phone] Fax [phone] Note: This document was prepared with digital dictation and possible smart phrase technology. Any transcriptional errors that result from this process are unintentional.

## 2024-02-03 NOTE — Progress Notes (Signed)
 Botox - 200 units x 1 vial Lot: [id number] Expiration: 05/2026 NDC: 9976-6078-97   Bacteriostatic 0.9% Sodium Chloride - 4mL  Lot: [id number] Expiration: 11/31/2026 NDC: 9590-8033-97   Dx: H56.288 SP  Witnessed by Sherrod BIRCH

## 2024-04-28 ENCOUNTER — Ambulatory Visit: Admitting: Adult Health

## 2024-04-29 ENCOUNTER — Ambulatory Visit: Admitting: Adult Health

## 2024-05-24 ENCOUNTER — Ambulatory Visit: Admitting: Neurology
